# Patient Record
Sex: Female | Born: 1985 | Race: White | Hispanic: No | Marital: Married | Smoking: Never smoker
Health system: Southern US, Community
[De-identification: ages and names within clinical notes are randomized; demographics above are authoritative.]

## PROBLEM LIST (undated history)

## (undated) ENCOUNTER — Inpatient Hospital Stay (HOSPITAL_COMMUNITY): Payer: Self-pay

## (undated) DIAGNOSIS — T7840XA Allergy, unspecified, initial encounter: Secondary | ICD-10-CM

## (undated) DIAGNOSIS — R519 Headache, unspecified: Secondary | ICD-10-CM

## (undated) DIAGNOSIS — R51 Headache: Secondary | ICD-10-CM

## (undated) DIAGNOSIS — T4145XA Adverse effect of unspecified anesthetic, initial encounter: Secondary | ICD-10-CM

## (undated) HISTORY — PX: URETHRAL DILATION: SUR417

## (undated) HISTORY — DX: Allergy, unspecified, initial encounter: T78.40XA

## (undated) HISTORY — PX: KIDNEY SURGERY: SHX687

## (undated) HISTORY — PX: APPENDECTOMY: SHX54

---

## 2007-08-26 DIAGNOSIS — T8859XA Other complications of anesthesia, initial encounter: Secondary | ICD-10-CM

## 2007-08-26 HISTORY — DX: Other complications of anesthesia, initial encounter: T88.59XA

## 2013-05-10 ENCOUNTER — Encounter: Payer: Self-pay | Admitting: Obstetrics & Gynecology

## 2013-05-10 ENCOUNTER — Ambulatory Visit (INDEPENDENT_AMBULATORY_CARE_PROVIDER_SITE_OTHER): Payer: 59 | Admitting: Obstetrics & Gynecology

## 2013-05-10 VITALS — BP 127/69 | HR 83 | Ht 62.0 in | Wt 157.0 lb

## 2013-05-10 DIAGNOSIS — Z30432 Encounter for removal of intrauterine contraceptive device: Secondary | ICD-10-CM

## 2013-05-10 DIAGNOSIS — Z01419 Encounter for gynecological examination (general) (routine) without abnormal findings: Secondary | ICD-10-CM

## 2013-05-10 DIAGNOSIS — Z124 Encounter for screening for malignant neoplasm of cervix: Secondary | ICD-10-CM

## 2013-05-10 NOTE — Patient Instructions (Signed)
Preventive Care for Adults, Female A healthy lifestyle and preventive care can promote health and wellness. Preventive health guidelines for women include the following key practices.  A routine yearly physical is a good way to check with your caregiver about your health and preventive screening. It is a chance to share any concerns and updates on your health, and to receive a thorough exam.  Visit your dentist for a routine exam and preventive care every 6 months. Brush your teeth twice a day and floss once a day. Good oral hygiene prevents tooth decay and gum disease.  The frequency of eye exams is based on your age, health, family medical history, use of contact lenses, and other factors. Follow your caregiver's recommendations for frequency of eye exams.  Eat a healthy diet. Foods like vegetables, fruits, whole grains, low-fat dairy products, and lean protein foods contain the nutrients you need without too many calories. Decrease your intake of foods high in solid fats, added sugars, and salt. Eat the right amount of calories for you.Get information about a proper diet from your caregiver, if necessary.  Regular physical exercise is one of the most important things you can do for your health. Most adults should get at least 150 minutes of moderate-intensity exercise (any activity that increases your heart rate and causes you to sweat) each week. In addition, most adults need muscle-strengthening exercises on 2 or more days a week.  Maintain a healthy weight. The body mass index (BMI) is a screening tool to identify possible weight problems. It provides an estimate of body fat based on height and weight. Your caregiver can help determine your BMI, and can help you achieve or maintain a healthy weight.For adults 20 years and older:  A BMI below 18.5 is considered underweight.  A BMI of 18.5 to 24.9 is normal.  A BMI of 25 to 29.9 is considered overweight.  A BMI of 30 and above is  considered obese.  Maintain normal blood lipids and cholesterol levels by exercising and minimizing your intake of saturated fat. Eat a balanced diet with plenty of fruit and vegetables. Blood tests for lipids and cholesterol should begin at age 41 and be repeated every 5 years. If your lipid or cholesterol levels are high, you are over 50, or you are at high risk for heart disease, you may need your cholesterol levels checked more frequently.Ongoing high lipid and cholesterol levels should be treated with medicines if diet and exercise are not effective.  If you smoke, find out from your caregiver how to quit. If you do not use tobacco, do not start.  If you are pregnant, do not drink alcohol. If you are breastfeeding, be very cautious about drinking alcohol. If you are not pregnant and choose to drink alcohol, do not exceed 1 drink per day. One drink is considered to be 12 ounces (355 mL) of beer, 5 ounces (148 mL) of wine, or 1.5 ounces (44 mL) of liquor.  Avoid use of street drugs. Do not share needles with anyone. Ask for help if you need support or instructions about stopping the use of drugs.  High blood pressure causes heart disease and increases the risk of stroke. Your blood pressure should be checked at least every 1 to 2 years. Ongoing high blood pressure should be treated with medicines if weight loss and exercise are not effective.  If you are 65 to 28 years old, ask your caregiver if you should take aspirin to prevent strokes.  Diabetes  screening involves taking a blood sample to check your fasting blood sugar level. This should be done once every 3 years, after age 45, if you are within normal weight and without risk factors for diabetes. Testing should be considered at a younger age or be carried out more frequently if you are overweight and have at least 1 risk factor for diabetes.  Breast cancer screening is essential preventive care for women. You should practice "breast  self-awareness." This means understanding the normal appearance and feel of your breasts and may include breast self-examination. Any changes detected, no matter how small, should be reported to a caregiver. Women in their 20s and 30s should have a clinical breast exam (CBE) by a caregiver as part of a regular health exam every 1 to 3 years. After age 40, women should have a CBE every year. Starting at age 40, women should consider having a mammography (breast X-ray test) every year. Women who have a family history of breast cancer should talk to their caregiver about genetic screening. Women at a high risk of breast cancer should talk to their caregivers about having magnetic resonance imaging (MRI) and a mammography every year.  The Pap test is a screening test for cervical cancer. A Pap test can show cell changes on the cervix that might become cervical cancer if left untreated. A Pap test is a procedure in which cells are obtained and examined from the lower end of the uterus (cervix).  Women should have a Pap test starting at age 21.  Between ages 21 and 29, Pap tests should be repeated every 2 years.  Beginning at age 30, you should have a Pap test every 3 years as long as the past 3 Pap tests have been normal.  Some women have medical problems that increase the chance of getting cervical cancer. Talk to your caregiver about these problems. It is especially important to talk to your caregiver if a new problem develops soon after your last Pap test. In these cases, your caregiver may recommend more frequent screening and Pap tests.  The above recommendations are the same for women who have or have not gotten the vaccine for human papillomavirus (HPV).  If you had a hysterectomy for a problem that was not cancer or a condition that could lead to cancer, then you no longer need Pap tests. Even if you no longer need a Pap test, a regular exam is a good idea to make sure no other problems are  starting.  If you are between ages 65 and 70, and you have had normal Pap tests going back 10 years, you no longer need Pap tests. Even if you no longer need a Pap test, a regular exam is a good idea to make sure no other problems are starting.  If you have had past treatment for cervical cancer or a condition that could lead to cancer, you need Pap tests and screening for cancer for at least 20 years after your treatment.  If Pap tests have been discontinued, risk factors (such as a new sexual partner) need to be reassessed to determine if screening should be resumed.  The HPV test is an additional test that may be used for cervical cancer screening. The HPV test looks for the virus that can cause the cell changes on the cervix. The cells collected during the Pap test can be tested for HPV. The HPV test could be used to screen women aged 30 years and older, and should   be used in women of any age who have unclear Pap test results. After the age of 30, women should have HPV testing at the same frequency as a Pap test.  Colorectal cancer can be detected and often prevented. Most routine colorectal cancer screening begins at the age of 50 and continues through age 75. However, your caregiver may recommend screening at an earlier age if you have risk factors for colon cancer. On a yearly basis, your caregiver may provide home test kits to check for hidden blood in the stool. Use of a small camera at the end of a tube, to directly examine the colon (sigmoidoscopy or colonoscopy), can detect the earliest forms of colorectal cancer. Talk to your caregiver about this at age 50, when routine screening begins. Direct examination of the colon should be repeated every 5 to 10 years through age 75, unless early forms of pre-cancerous polyps or small growths are found.  Hepatitis C blood testing is recommended for all people born from 1945 through 1965 and any individual with known risks for hepatitis C.  Practice  safe sex. Use condoms and avoid high-risk sexual practices to reduce the spread of sexually transmitted infections (STIs). STIs include gonorrhea, chlamydia, syphilis, trichomonas, herpes, HPV, and human immunodeficiency virus (HIV). Herpes, HIV, and HPV are viral illnesses that have no cure. They can result in disability, cancer, and death. Sexually active women aged 25 and younger should be checked for chlamydia. Older women with new or multiple partners should also be tested for chlamydia. Testing for other STIs is recommended if you are sexually active and at increased risk.  Osteoporosis is a disease in which the bones lose minerals and strength with aging. This can result in serious bone fractures. The risk of osteoporosis can be identified using a bone density scan. Women ages 65 and over and women at risk for fractures or osteoporosis should discuss screening with their caregivers. Ask your caregiver whether you should take a calcium supplement or vitamin D to reduce the rate of osteoporosis.  Menopause can be associated with physical symptoms and risks. Hormone replacement therapy is available to decrease symptoms and risks. You should talk to your caregiver about whether hormone replacement therapy is right for you.  Use sunscreen with sun protection factor (SPF) of 30 or more. Apply sunscreen liberally and repeatedly throughout the day. You should seek shade when your shadow is shorter than you. Protect yourself by wearing long sleeves, pants, a wide-brimmed hat, and sunglasses year round, whenever you are outdoors.  Once a month, do a whole body skin exam, using a mirror to look at the skin on your back. Notify your caregiver of new moles, moles that have irregular borders, moles that are larger than a pencil eraser, or moles that have changed in shape or color.  Stay current with required immunizations.  Influenza. You need a dose every fall (or winter). The composition of the flu vaccine  changes each year, so being vaccinated once is not enough.  Pneumococcal polysaccharide. You need 1 to 2 doses if you smoke cigarettes or if you have certain chronic medical conditions. You need 1 dose at age 65 (or older) if you have never been vaccinated.  Tetanus, diphtheria, pertussis (Tdap, Td). Get 1 dose of Tdap vaccine if you are younger than age 65, are over 65 and have contact with an infant, are a healthcare worker, are pregnant, or simply want to be protected from whooping cough. After that, you need a Td   booster dose every 10 years. Consult your caregiver if you have not had at least 3 tetanus and diphtheria-containing shots sometime in your life or have a deep or dirty wound.  HPV. You need this vaccine if you are a woman age 26 or younger. The vaccine is given in 3 doses over 6 months.  Measles, mumps, rubella (MMR). You need at least 1 dose of MMR if you were born in 1957 or later. You may also need a second dose.  Meningococcal. If you are age 19 to 21 and a first-year college student living in a residence hall, or have one of several medical conditions, you need to get vaccinated against meningococcal disease. You may also need additional booster doses.  Zoster (shingles). If you are age 60 or older, you should get this vaccine.  Varicella (chickenpox). If you have never had chickenpox or you were vaccinated but received only 1 dose, talk to your caregiver to find out if you need this vaccine.  Hepatitis A. You need this vaccine if you have a specific risk factor for hepatitis A virus infection or you simply wish to be protected from this disease. The vaccine is usually given as 2 doses, 6 to 18 months apart.  Hepatitis B. You need this vaccine if you have a specific risk factor for hepatitis B virus infection or you simply wish to be protected from this disease. The vaccine is given in 3 doses, usually over 6 months. Preventive Services / Frequency Ages 19 to 39  Blood  pressure check.** / Every 1 to 2 years.  Lipid and cholesterol check.** / Every 5 years beginning at age 20.  Clinical breast exam.** / Every 3 years for women in their 20s and 30s.  Pap test.** / Every 2 years from ages 21 through 29. Every 3 years starting at age 30 through age 65 or 70 with a history of 3 consecutive normal Pap tests.  HPV screening.** / Every 3 years from ages 30 through ages 65 to 70 with a history of 3 consecutive normal Pap tests.  Hepatitis C blood test.** / For any individual with known risks for hepatitis C.  Skin self-exam. / Monthly.  Influenza immunization.** / Every year.  Pneumococcal polysaccharide immunization.** / 1 to 2 doses if you smoke cigarettes or if you have certain chronic medical conditions.  Tetanus, diphtheria, pertussis (Tdap, Td) immunization. / A one-time dose of Tdap vaccine. After that, you need a Td booster dose every 10 years.  HPV immunization. / 3 doses over 6 months, if you are 26 and younger.  Measles, mumps, rubella (MMR) immunization. / You need at least 1 dose of MMR if you were born in 1957 or later. You may also need a second dose.  Meningococcal immunization. / 1 dose if you are age 19 to 21 and a first-year college student living in a residence hall, or have one of several medical conditions, you need to get vaccinated against meningococcal disease. You may also need additional booster doses.  Varicella immunization.** / Consult your caregiver.  Hepatitis A immunization.** / Consult your caregiver. 2 doses, 6 to 18 months apart.  Hepatitis B immunization.** / Consult your caregiver. 3 doses usually over 6 months. Ages 40 to 64  Blood pressure check.** / Every 1 to 2 years.  Lipid and cholesterol check.** / Every 5 years beginning at age 20.  Clinical breast exam.** / Every year after age 40.  Mammogram.** / Every year beginning at age 40   and continuing for as long as you are in good health. Consult with your  caregiver.  Pap test.** / Every 3 years starting at age 30 through age 65 or 70 with a history of 3 consecutive normal Pap tests.  HPV screening.** / Every 3 years from ages 30 through ages 65 to 70 with a history of 3 consecutive normal Pap tests.  Fecal occult blood test (FOBT) of stool. / Every year beginning at age 50 and continuing until age 75. You may not need to do this test if you get a colonoscopy every 10 years.  Flexible sigmoidoscopy or colonoscopy.** / Every 5 years for a flexible sigmoidoscopy or every 10 years for a colonoscopy beginning at age 50 and continuing until age 75.  Hepatitis C blood test.** / For all people born from 1945 through 1965 and any individual with known risks for hepatitis C.  Skin self-exam. / Monthly.  Influenza immunization.** / Every year.  Pneumococcal polysaccharide immunization.** / 1 to 2 doses if you smoke cigarettes or if you have certain chronic medical conditions.  Tetanus, diphtheria, pertussis (Tdap, Td) immunization.** / A one-time dose of Tdap vaccine. After that, you need a Td booster dose every 10 years.  Measles, mumps, rubella (MMR) immunization. / You need at least 1 dose of MMR if you were born in 1957 or later. You may also need a second dose.  Varicella immunization.** / Consult your caregiver.  Meningococcal immunization.** / Consult your caregiver.  Hepatitis A immunization.** / Consult your caregiver. 2 doses, 6 to 18 months apart.  Hepatitis B immunization.** / Consult your caregiver. 3 doses, usually over 6 months. Ages 65 and over  Blood pressure check.** / Every 1 to 2 years.  Lipid and cholesterol check.** / Every 5 years beginning at age 20.  Clinical breast exam.** / Every year after age 40.  Mammogram.** / Every year beginning at age 40 and continuing for as long as you are in good health. Consult with your caregiver.  Pap test.** / Every 3 years starting at age 30 through age 65 or 70 with a 3  consecutive normal Pap tests. Testing can be stopped between 65 and 70 with 3 consecutive normal Pap tests and no abnormal Pap or HPV tests in the past 10 years.  HPV screening.** / Every 3 years from ages 30 through ages 65 or 70 with a history of 3 consecutive normal Pap tests. Testing can be stopped between 65 and 70 with 3 consecutive normal Pap tests and no abnormal Pap or HPV tests in the past 10 years.  Fecal occult blood test (FOBT) of stool. / Every year beginning at age 50 and continuing until age 75. You may not need to do this test if you get a colonoscopy every 10 years.  Flexible sigmoidoscopy or colonoscopy.** / Every 5 years for a flexible sigmoidoscopy or every 10 years for a colonoscopy beginning at age 50 and continuing until age 75.  Hepatitis C blood test.** / For all people born from 1945 through 1965 and any individual with known risks for hepatitis C.  Osteoporosis screening.** / A one-time screening for women ages 65 and over and women at risk for fractures or osteoporosis.  Skin self-exam. / Monthly.  Influenza immunization.** / Every year.  Pneumococcal polysaccharide immunization.** / 1 dose at age 65 (or older) if you have never been vaccinated.  Tetanus, diphtheria, pertussis (Tdap, Td) immunization. / A one-time dose of Tdap vaccine if you are over   65 and have contact with an infant, are a Research scientist (physical sciences), or simply want to be protected from whooping cough. After that, you need a Td booster dose every 10 years.  Varicella immunization.** / Consult your caregiver.  Meningococcal immunization.** / Consult your caregiver.  Hepatitis A immunization.** / Consult your caregiver. 2 doses, 6 to 18 months apart.  Hepatitis B immunization.** / Check with your caregiver. 3 doses, usually over 6 months. ** Family history and personal history of risk and conditions may change your caregiver's recommendations. Document Released: 10/07/2001 Document Revised: 11/03/2011  Document Reviewed: 01/06/2011 [facility] Patient Information 2014 [place], [place].  Thank you for enrolling in MyChart. Please follow the instructions below to securely access your online medical record. MyChart allows you to send messages to your doctor, view your test results, manage appointments, and more.   How Do I Sign Up? 1. In your Internet browser, go to [employer] and enter [email or web address]. 2. Click on the Sign Up Now link in the Sign In box. You will see the New Member Sign Up page. 3. Enter your MyChart Access Code exactly as it appears below. You will not need to use this code after you've completed the sign-up process. If you do not sign up before the expiration date, you must request a new code. MyChart Access Code: [email or web address] Expires: 07/09/2013  9:43 AM  4. Enter your Social Security Number ([id number]) and Date of Birth (mm/dd/yyyy) as indicated and click Submit. You will be taken to the next sign-up page. 5. Create a MyChart ID. This will be your MyChart login ID and cannot be changed, so think of one that is secure and easy to remember. 6. Create a MyChart password. You can change your password at any time. 7. Enter your Password Reset Question and Answer. This can be used at a later time if you forget your password.  8. Enter your e-mail address. You will receive e-mail notification when new information is available in MyChart. 9. Click Sign Up. You can now view your medical record.   Additional Information Remember, MyChart is NOT to be used for urgent needs. For medical emergencies, dial 911.

## 2013-05-10 NOTE — Progress Notes (Signed)
  Subjective:     Loretta Sanders is a 27 y.o. G62P1102 female and is here for a comprehensive physical exam. The patient reports no problems. She desires Mirena IUD removal, unable to feel strings.  IUD placed 5 years ago, will plans to use condoms for contraception until she can tlak her husband into getting a vasectomy.   History   Social History  . Marital Status: Married    Spouse Name: N/A    Number of Children: N/A  . Years of Education: N/A   Occupational History  . Not on file.   Social History Main Topics  . Smoking status: Never Smoker   . Smokeless tobacco: Never Used  . Alcohol Use: Yes     Comment: rare  . Drug Use: No  . Sexual Activity: Yes    Partners: Male    Birth Control/ Protection: IUD   Other Topics Concern  . Not on file   Social History Narrative  . No narrative on file   No health maintenance topics applied.  The following portions of the patient's history were reviewed and updated as appropriate: allergies, current medications, past family history, past medical history, past social history, past surgical history and problem list.  Review of Systems Pertinent items are noted in HPI.   Objective:   BP 127/69  Pulse 83  Ht 5\' 2"  (1.575 m)  Wt 157 lb (71.215 kg)  BMI 28.71 kg/m2 GENERAL: Well-developed, well-nourished female in no acute distress.  HEENT: Normocephalic, atraumatic. Sclerae anicteric.  NECK: Supple. Normal thyroid.  LUNGS: Clear to auscultation bilaterally.  HEART: Regular rate and rhythm. BREASTS: Symmetric in size. No masses, skin changes, nipple drainage, or lymphadenopathy. ABDOMEN: Soft, nontender, nondistended. No organomegaly. PELVIC: Normal external female genitalia. Vagina is pink and rugated.  Normal discharge. Normal cervix contour, no IUD strings seen. Pap smear obtained. Uterus is normal in size. No adnexal mass or tenderness.  EXTREMITIES: No cyanosis, clubbing, or edema, 2+ distal pulses.  IUD Removal  Patient  was in the dorsal lithotomy position, normal external genitalia was noted.  A speculum was placed in the patient's vagina, normal discharge was noted, no lesions. The cervix was visualized, no lesions, no abnormal discharge.  The strings of the IUD were not visualized, so there cervix was swabbed with betadine. Given that she had a pinpoint cervical os, plastic dilators were used to dilate there cervical canal, and bedside ultrasound did show the IUD in the endometrial cavity.  Kelly forceps were then introduced into the endometrial cavity and after many attempts under ultrasound guidance,  the IUD was grasped and removed in its entirety.  Patient tolerated the procedure well.    Assessment:   Healthy female exam IUD removal   Plan:   Pap done, will follow up results and manage accordingly. Patient to let us know if she changes her mind about her long term contraception modality Routine preventative health maintenance measures emphasized

## 2013-08-25 LAB — HM PAP SMEAR: HM Pap smear: NORMAL

## 2014-05-11 ENCOUNTER — Encounter: Payer: Self-pay | Admitting: Obstetrics & Gynecology

## 2014-05-11 ENCOUNTER — Ambulatory Visit (INDEPENDENT_AMBULATORY_CARE_PROVIDER_SITE_OTHER): Payer: 59 | Admitting: Obstetrics & Gynecology

## 2014-05-11 VITALS — BP 104/73 | HR 75 | Wt 147.0 lb

## 2014-05-11 DIAGNOSIS — O2390 Unspecified genitourinary tract infection in pregnancy, unspecified trimester: Secondary | ICD-10-CM

## 2014-05-11 DIAGNOSIS — Z3480 Encounter for supervision of other normal pregnancy, unspecified trimester: Secondary | ICD-10-CM

## 2014-05-11 DIAGNOSIS — O09219 Supervision of pregnancy with history of pre-term labor, unspecified trimester: Secondary | ICD-10-CM

## 2014-05-11 DIAGNOSIS — N390 Urinary tract infection, site not specified: Secondary | ICD-10-CM

## 2014-05-11 DIAGNOSIS — O34219 Maternal care for unspecified type scar from previous cesarean delivery: Secondary | ICD-10-CM | POA: Insufficient documentation

## 2014-05-11 DIAGNOSIS — O2341 Unspecified infection of urinary tract in pregnancy, first trimester: Secondary | ICD-10-CM

## 2014-05-11 NOTE — Progress Notes (Signed)
     Subjective:    Loretta Sanders is a 28 y.o. G3P1102 at [redacted]w[redacted]d being seen today for her first obstetrical visit.  Her obstetrical history is significant for previous preterm delivery at 34 weeks (was on 17P last pregnancy and got to 39 weeks), two previous cesarean deliveries. Patient does intend to breast feed. Pregnancy history fully reviewed.  Patient reports no complaints.  Filed Vitals:   05/11/14 1027  BP: 104/73  Pulse: 75  Weight: 147 lb (66.679 kg)    HISTORY: OB History  Gravida Para Term Preterm AB SAB TAB Ectopic Multiple Living  # Outcome Date GA Lbr Len/2nd Weight Sex Delivery Anes PTL Lv  3 CUR           2 TRM 04/14/08 [redacted]w[redacted]d   F [facility]        Comments: Received 17P  1 PRE 10/31/06 [redacted]w[redacted]d   F [facility]        Past Medical History  Diagnosis Date  . Allergy    Past Surgical History  Procedure Laterality Date  . Kidney surgery      age 66  . Cesarean section      X 2 in 2008, 2009   Family History  Problem Relation Age of Onset  . COPD Maternal Grandfather     lung  . COPD Paternal Grandmother     lung  . COPD Paternal Grandfather     lung cancer     Exam    Uterus:     Pelvic Exam:    Perineum: No Hemorrhoids, Normal Perineum   Vulva: normal   Vagina:  normal mucosa, normal discharge   Cervix: no bleeding following Pap and no lesions   Adnexa: normal adnexa and no mass, fullness, tenderness   Bony Pelvis: average  System: Breast:  normal appearance, no masses or tenderness, Inspection negative   Skin: normal coloration and turgor, no rashes   Neurologic: oriented, normal   Extremities: normal strength, tone, and muscle mass   HEENT PERRLA and extra ocular movement intact   Mouth/Teeth mucous membranes moist, pharynx normal without lesions and dental hygiene good   Neck supple and no masses   Cardiovascular: regular rate and rhythm   Respiratory:  appears well, vitals normal, no respiratory distress, acyanotic, normal RR, chest  clear, no wheezing, crepitations, rhonchi, normal symmetric air entry   Abdomen: soft, non-tender; bowel sounds normal; no masses,  no organomegaly   Urinary: urethral meatus normal   Clinic scan: Viable 7 week SIUP   Assessment:    Pregnancy: [id number] Patient Active Problem List   Diagnosis Date Noted  . Previous preterm delivery at 39 weeks affecting pregnancy, antepartum 05/11/2014  . Previous cesarean delivery x 2, antepartum 05/11/2014    Plan:   Initial labs drawn. Continue prenatal vitamins. Will start 17P at 16 weeks, continue weekly until 36 weeks Interested in RCS and BTL for delivery Problem list reviewed and updated. Genetic Screening discussed Quad Screen: to be ordered later. Ultrasound discussed; fetal survey: ordered. The nature of [facility] with multiple MDs and other Advanced Practitioners was explained to patient; also emphasized that residents, students are part of our team. Follow up in 4 weeks.  Routine obstetric precautions reviewed.  Tereso Newcomer, MD 05/11/2014

## 2014-05-11 NOTE — Progress Notes (Signed)
New OB, last pap was May 10, 2013.  It was negative.  Bedside ultrasound shows CRL measuring 7wk 0days. Consistent with LMP dating of 7wk 2days.

## 2014-05-11 NOTE — Patient Instructions (Addendum)
Thank you for enrolling in MyChart. Please follow the instructions below to securely access your online medical record. MyChart allows you to send messages to your doctor, view your test results, manage appointments, and more.   How Do I Sign Up? 1. In your Internet browser, go to [employer] and enter [email or web address]. 2. Click on the Sign Up Now link in the Sign In box. You will see the New Member Sign Up page. 3. Enter your MyChart Access Code exactly as it appears below. You will not need to use this code after you've completed the sign-up process. If you do not sign up before the expiration date, you must request a new code.  MyChart Access Code: [email or web address] Expires: 07/10/2014 11:16 AM  4. Enter your Social Security Number ([email or web address]) and Date of Birth (mm/dd/yyyy) as indicated and click Submit. You will be taken to the next sign-up page. 5. Create a MyChart ID. This will be your MyChart login ID and cannot be changed, so think of one that is secure and easy to remember. 6. Create a MyChart password. You can change your password at any time. 7. Enter your Password Reset Question and Answer. This can be used at a later time if you forget your password.  8. Enter your e-mail address. You will receive e-mail notification when new information is available in MyChart. 9. Click Sign Up. You can now view your medical record.   Additional Information Remember, MyChart is NOT to be used for urgent needs. For medical emergencies, dial 911.    First Trimester of Pregnancy The first trimester of pregnancy is from week 1 until the end of week 12 (months 1 through 3). A week after a sperm fertilizes an egg, the egg will implant on the wall of the uterus. This embryo will begin to develop into a baby. Genes from you and your partner are forming the baby. The female genes determine whether the baby is a boy or a girl. At 6-8 weeks, the eyes and face are formed, and the heartbeat  can be seen on ultrasound. At the end of 12 weeks, all the baby's organs are formed.  Now that you are pregnant, you will want to do everything you can to have a healthy baby. Two of the most important things are to get good prenatal care and to follow your health care provider's instructions. Prenatal care is all the medical care you receive before the baby's birth. This care will help prevent, find, and treat any problems during the pregnancy and childbirth. BODY CHANGES Your body goes through many changes during pregnancy. The changes vary from woman to woman.   You may gain or lose a couple of pounds at first.  You may feel sick to your stomach (nauseous) and throw up (vomit). If the vomiting is uncontrollable, call your health care provider.  You may tire easily.  You may develop headaches that can be relieved by medicines approved by your health care provider.  You may urinate more often. Painful urination may mean you have a bladder infection.  You may develop heartburn as a result of your pregnancy.  You may develop constipation because certain hormones are causing the muscles that push waste through your intestines to slow down.  You may develop hemorrhoids or swollen, bulging veins (varicose veins).  Your breasts may begin to grow larger and become tender. Your nipples may stick out more, and the tissue that surrounds them (areola) may become darker.  Your gums may  bleed and may be sensitive to brushing and flossing.  Dark spots or blotches (chloasma, mask of pregnancy) may develop on your face. This will likely fade after the baby is born.  Your menstrual periods will stop.  You may have a loss of appetite.  You may develop cravings for certain kinds of food.  You may have changes in your emotions from day to day, such as being excited to be pregnant or being concerned that something may go wrong with the pregnancy and baby.  You may have more vivid and strange  dreams.  You may have changes in your hair. These can include thickening of your hair, rapid growth, and changes in texture. Some women also have hair loss during or after pregnancy, or hair that feels dry or thin. Your hair will most likely return to normal after your baby is born. WHAT TO EXPECT AT YOUR PRENATAL VISITS During a routine prenatal visit:  You will be weighed to make sure you and the baby are growing normally.  Your blood pressure will be taken.  Your abdomen will be measured to track your baby's growth.  The fetal heartbeat will be listened to starting around week 10 or 12 of your pregnancy.  Test results from any previous visits will be discussed. Your health care provider may ask you:  How you are feeling.  If you are feeling the baby move.  If you have had any abnormal symptoms, such as leaking fluid, bleeding, severe headaches, or abdominal cramping.  If you have any questions. Other tests that may be performed during your first trimester include:  Blood tests to find your blood type and to check for the presence of any previous infections. They will also be used to check for low iron levels (anemia) and Rh antibodies. Later in the pregnancy, blood tests for diabetes will be done along with other tests if problems develop.  Urine tests to check for infections, diabetes, or protein in the urine.  An ultrasound to confirm the proper growth and development of the baby.  An amniocentesis to check for possible genetic problems.  Fetal screens for spina bifida and Down syndrome.  You may need other tests to make sure you and the baby are doing well. HOME CARE INSTRUCTIONS  Medicines  Follow your health care provider's instructions regarding medicine use. Specific medicines may be either safe or unsafe to take during pregnancy.  Take your prenatal vitamins as directed.  If you develop constipation, try taking a stool softener if your health care provider  approves. Diet  Eat regular, well-balanced meals. Choose a variety of foods, such as meat or vegetable-based protein, fish, milk and low-fat dairy products, vegetables, fruits, and whole grain breads and cereals. Your health care provider will help you determine the amount of weight gain that is right for you.  Avoid raw meat and uncooked cheese. These carry germs that can cause birth defects in the baby.  Eating four or five small meals rather than three large meals a day may help relieve nausea and vomiting. If you start to feel nauseous, eating a few soda crackers can be helpful. Drinking liquids between meals instead of during meals also seems to help nausea and vomiting.  If you develop constipation, eat more high-fiber foods, such as fresh vegetables or fruit and whole grains. Drink enough fluids to keep your urine clear or pale yellow. Activity and Exercise  Exercise only as directed by your health care provider. Exercising will help you:  Control your weight.  Stay in shape.  Be prepared for labor and delivery.  Experiencing pain or cramping in the lower abdomen or low back is a good sign that you should stop exercising. Check with your health care provider before continuing normal exercises.  Try to avoid standing for long periods of time. Move your legs often if you must stand in one place for a long time.  Avoid heavy lifting.  Wear low-heeled shoes, and practice good posture.  You may continue to have sex unless your health care provider directs you otherwise. Relief of Pain or Discomfort  Wear a good support bra for breast tenderness.   Take warm sitz baths to soothe any pain or discomfort caused by hemorrhoids. Use hemorrhoid cream if your health care provider approves.   Rest with your legs elevated if you have leg cramps or low back pain.  If you develop varicose veins in your legs, wear support hose. Elevate your feet for 15 minutes, 3-4 times a day. Limit salt  in your diet. Prenatal Care  Schedule your prenatal visits by the twelfth week of pregnancy. They are usually scheduled monthly at first, then more often in the last 2 months before delivery.  Write down your questions. Take them to your prenatal visits.  Keep all your prenatal visits as directed by your health care provider. Safety  Wear your seat belt at all times when driving.  Make a list of emergency phone numbers, including numbers for family, friends, the hospital, and police and fire departments. General Tips  Ask your health care provider for a referral to a local prenatal education class. Begin classes no later than at the beginning of month 6 of your pregnancy.  Ask for help if you have counseling or nutritional needs during pregnancy. Your health care provider can offer advice or refer you to specialists for help with various needs.  Do not use hot tubs, steam rooms, or saunas.  Do not douche or use tampons or scented sanitary pads.  Do not cross your legs for long periods of time.  Avoid cat litter boxes and soil used by cats. These carry germs that can cause birth defects in the baby and possibly loss of the fetus by miscarriage or stillbirth.  Avoid all smoking, herbs, alcohol, and medicines not prescribed by your health care provider. Chemicals in these affect the formation and growth of the baby.  Schedule a dentist appointment. At home, brush your teeth with a soft toothbrush and be gentle when you floss. SEEK MEDICAL CARE IF:   You have dizziness.  You have mild pelvic cramps, pelvic pressure, or nagging pain in the abdominal area.  You have persistent nausea, vomiting, or diarrhea.  You have a bad smelling vaginal discharge.  You have pain with urination.  You notice increased swelling in your face, hands, legs, or ankles. SEEK IMMEDIATE MEDICAL CARE IF:   You have a fever.  You are leaking fluid from your vagina.  You have spotting or bleeding  from your vagina.  You have severe abdominal cramping or pain.  You have rapid weight gain or loss.  You vomit blood or material that looks like coffee grounds.  You are exposed to [place] measles and have never had them.  You are exposed to fifth disease or chickenpox.  You develop a severe headache.  You have shortness of breath.  You have any kind of trauma, such as from a fall or a car accident. Document Released: 08/05/2001  Document Revised: 12/26/2013 Document Reviewed: 06/21/2013 [facility] Patient Information 2015 [place], [place]. This information is not intended to replace advice given to you by your health care provider. Make sure you discuss any questions you have with your health care provider.

## 2014-05-12 LAB — OBSTETRIC PANEL
Antibody Screen: NEGATIVE
BASOS PCT: 0 % (ref 0–1)
Basophils Absolute: 0 10*3/uL (ref 0.0–0.1)
EOS ABS: 0 10*3/uL (ref 0.0–0.7)
Eosinophils Relative: 0 % (ref 0–5)
HCT: 38.5 % (ref 36.0–46.0)
HEMOGLOBIN: 13.3 g/dL (ref 12.0–15.0)
HEP B S AG: NEGATIVE
LYMPHS ABS: 2.6 10*3/uL (ref 0.7–4.0)
Lymphocytes Relative: 23 % (ref 12–46)
MCH: 30.6 pg (ref 26.0–34.0)
MCHC: 34.5 g/dL (ref 30.0–36.0)
MCV: 88.5 fL (ref 78.0–100.0)
MONOS PCT: 5 % (ref 3–12)
Monocytes Absolute: 0.6 10*3/uL (ref 0.1–1.0)
NEUTROS ABS: 8.1 10*3/uL — AB (ref 1.7–7.7)
NEUTROS PCT: 72 % (ref 43–77)
PLATELETS: 318 10*3/uL (ref 150–400)
RBC: 4.35 MIL/uL (ref 3.87–5.11)
RDW: 12.7 % (ref 11.5–15.5)
RH TYPE: POSITIVE
Rubella: 1.81 Index — ABNORMAL HIGH (ref <0.90)
WBC: 11.3 10*3/uL — ABNORMAL HIGH (ref 4.0–10.5)

## 2014-05-12 LAB — HIV ANTIBODY (ROUTINE TESTING W REFLEX): HIV 1&2 Ab, 4th Generation: NONREACTIVE

## 2014-05-14 LAB — CULTURE, OB URINE

## 2014-05-15 ENCOUNTER — Telehealth: Payer: Self-pay | Admitting: *Deleted

## 2014-05-15 MED ORDER — CEPHALEXIN 500 MG PO CAPS: 500.0000 mg | ORAL_CAPSULE | Freq: Four times a day (QID) | ORAL | Status: DC

## 2014-05-15 NOTE — Telephone Encounter (Signed)
Patient was notified and she will pick up medication today.

## 2014-05-15 NOTE — Addendum Note (Signed)
Addended by: Jaynie Collins A on: 05/15/2014 12:15 PM   Modules accepted: Orders

## 2014-05-15 NOTE — Telephone Encounter (Signed)
Message copied by Barbara Cower on Mon May 15, 2014 12:23 PM ------      Message from: Jaynie Collins A      Created: Mon May 15, 2014 12:16 PM       Urine culture shows >100K E.coli, Keflex prescribed.  Please call to inform patient of results and advise her to pick up prescription.        ------

## 2014-06-12 ENCOUNTER — Encounter: Payer: Self-pay | Admitting: Family Medicine

## 2014-06-12 ENCOUNTER — Ambulatory Visit (INDEPENDENT_AMBULATORY_CARE_PROVIDER_SITE_OTHER): Payer: 59 | Admitting: Family Medicine

## 2014-06-12 VITALS — BP 140/72 | HR 87 | Wt 148.0 lb

## 2014-06-12 DIAGNOSIS — O34219 Maternal care for unspecified type scar from previous cesarean delivery: Secondary | ICD-10-CM

## 2014-06-12 DIAGNOSIS — O09219 Supervision of pregnancy with history of pre-term labor, unspecified trimester: Secondary | ICD-10-CM

## 2014-06-12 DIAGNOSIS — O3421 Maternal care for scar from previous cesarean delivery: Secondary | ICD-10-CM

## 2014-06-12 MED ORDER — HYDROXYPROGESTERONE CAPROATE 250 MG/ML IM OIL: 250.0000 mg | TOPICAL_OIL | Freq: Once | INTRAMUSCULAR | Status: DC

## 2014-06-12 NOTE — Patient Instructions (Signed)
First Trimester of Pregnancy The first trimester of pregnancy is from week 1 until the end of week 12 (months 1 through 3). A week after a sperm fertilizes an egg, the egg will implant on the wall of the uterus. This embryo will begin to develop into a baby. Genes from you and your partner are forming the baby. The female genes determine whether the baby is a boy or a girl. At 6-8 weeks, the eyes and face are formed, and the heartbeat can be seen on ultrasound. At the end of 12 weeks, all the baby's organs are formed.  Now that you are pregnant, you will want to do everything you can to have a healthy baby. Two of the most important things are to get good prenatal care and to follow your health care provider's instructions. Prenatal care is all the medical care you receive before the baby's birth. This care will help prevent, find, and treat any problems during the pregnancy and childbirth. BODY CHANGES Your body goes through many changes during pregnancy. The changes vary from woman to woman.   You may gain or lose a couple of pounds at first.  You may feel sick to your stomach (nauseous) and throw up (vomit). If the vomiting is uncontrollable, call your health care provider.  You may tire easily.  You may develop headaches that can be relieved by medicines approved by your health care provider.  You may urinate more often. Painful urination may mean you have a bladder infection.  You may develop heartburn as a result of your pregnancy.  You may develop constipation because certain hormones are causing the muscles that push waste through your intestines to slow down.  You may develop hemorrhoids or swollen, bulging veins (varicose veins).  Your breasts may begin to grow larger and become tender. Your nipples may stick out more, and the tissue that surrounds them (areola) may become darker.  Your gums may bleed and may be sensitive to brushing and flossing.  Dark spots or blotches  (chloasma, mask of pregnancy) may develop on your face. This will likely fade after the baby is born.  Your menstrual periods will stop.  You may have a loss of appetite.  You may develop cravings for certain kinds of food.  You may have changes in your emotions from day to day, such as being excited to be pregnant or being concerned that something may go wrong with the pregnancy and baby.  You may have more vivid and strange dreams.  You may have changes in your hair. These can include thickening of your hair, rapid growth, and changes in texture. Some women also have hair loss during or after pregnancy, or hair that feels dry or thin. Your hair will most likely return to normal after your baby is born. WHAT TO EXPECT AT YOUR PRENATAL VISITS During a routine prenatal visit:  You will be weighed to make sure you and the baby are growing normally.  Your blood pressure will be taken.  Your abdomen will be measured to track your baby's growth.  The fetal heartbeat will be listened to starting around week 10 or 12 of your pregnancy.  Test results from any previous visits will be discussed. Your health care provider may ask you:  How you are feeling.  If you are feeling the baby move.  If you have had any abnormal symptoms, such as leaking fluid, bleeding, severe headaches, or abdominal cramping.  If you have any questions. Other tests   that may be performed during your first trimester include:  Blood tests to find your blood type and to check for the presence of any previous infections. They will also be used to check for low iron levels (anemia) and Rh antibodies. Later in the pregnancy, blood tests for diabetes will be done along with other tests if problems develop.  Urine tests to check for infections, diabetes, or protein in the urine.  An ultrasound to confirm the proper growth and development of the baby.  An amniocentesis to check for possible genetic problems.  Fetal  screens for spina bifida and Down syndrome.  You may need other tests to make sure you and the baby are doing well. HOME CARE INSTRUCTIONS  Medicines  Follow your health care provider's instructions regarding medicine use. Specific medicines may be either safe or unsafe to take during pregnancy.  Take your prenatal vitamins as directed.  If you develop constipation, try taking a stool softener if your health care provider approves. Diet  Eat regular, well-balanced meals. Choose a variety of foods, such as meat or vegetable-based protein, fish, milk and low-fat dairy products, vegetables, fruits, and whole grain breads and cereals. Your health care provider will help you determine the amount of weight gain that is right for you.  Avoid raw meat and uncooked cheese. These carry germs that can cause birth defects in the baby.  Eating four or five small meals rather than three large meals a day may help relieve nausea and vomiting. If you start to feel nauseous, eating a few soda crackers can be helpful. Drinking liquids between meals instead of during meals also seems to help nausea and vomiting.  If you develop constipation, eat more high-fiber foods, such as fresh vegetables or fruit and whole grains. Drink enough fluids to keep your urine clear or pale yellow. Activity and Exercise  Exercise only as directed by your health care provider. Exercising will help you:  Control your weight.  Stay in shape.  Be prepared for labor and delivery.  Experiencing pain or cramping in the lower abdomen or low back is a good sign that you should stop exercising. Check with your health care provider before continuing normal exercises.  Try to avoid standing for long periods of time. Move your legs often if you must stand in one place for a long time.  Avoid heavy lifting.  Wear low-heeled shoes, and practice good posture.  You may continue to have sex unless your health care provider directs you  otherwise. Relief of Pain or Discomfort  Wear a good support bra for breast tenderness.   Take warm sitz baths to soothe any pain or discomfort caused by hemorrhoids. Use hemorrhoid cream if your health care provider approves.   Rest with your legs elevated if you have leg cramps or low back pain.  If you develop varicose veins in your legs, wear support hose. Elevate your feet for 15 minutes, 3-4 times a day. Limit salt in your diet. Prenatal Care  Schedule your prenatal visits by the twelfth week of pregnancy. They are usually scheduled monthly at first, then more often in the last 2 months before delivery.  Write down your questions. Take them to your prenatal visits.  Keep all your prenatal visits as directed by your health care provider. Safety  Wear your seat belt at all times when driving.  Make a list of emergency phone numbers, including numbers for family, friends, the hospital, and police and fire departments. General Tips    Ask your health care provider for a referral to a local prenatal education class. Begin classes no later than at the beginning of month 6 of your pregnancy.  Ask for help if you have counseling or nutritional needs during pregnancy. Your health care provider can offer advice or refer you to specialists for help with various needs.  Do not use hot tubs, steam rooms, or saunas.  Do not douche or use tampons or scented sanitary pads.  Do not cross your legs for long periods of time.  Avoid cat litter boxes and soil used by cats. These carry germs that can cause birth defects in the baby and possibly loss of the fetus by miscarriage or stillbirth.  Avoid all smoking, herbs, alcohol, and medicines not prescribed by your health care provider. Chemicals in these affect the formation and growth of the baby.  Schedule a dentist appointment. At home, brush your teeth with a soft toothbrush and be gentle when you floss. SEEK MEDICAL CARE IF:   You have  dizziness.  You have mild pelvic cramps, pelvic pressure, or nagging pain in the abdominal area.  You have persistent nausea, vomiting, or diarrhea.  You have a bad smelling vaginal discharge.  You have pain with urination.  You notice increased swelling in your face, hands, legs, or ankles. SEEK IMMEDIATE MEDICAL CARE IF:   You have a fever.  You are leaking fluid from your vagina.  You have spotting or bleeding from your vagina.  You have severe abdominal cramping or pain.  You have rapid weight gain or loss.  You vomit blood or material that looks like coffee grounds.  You are exposed to German measles and have never had them.  You are exposed to fifth disease or chickenpox.  You develop a severe headache.  You have shortness of breath.  You have any kind of trauma, such as from a fall or a car accident. Document Released: 08/05/2001 Document Revised: 12/26/2013 Document Reviewed: 06/21/2013 [employer] Patient Information 2015 [employer]. This information is not intended to replace advice given to you by your health care provider. Make sure you discuss any questions you have with your health care provider.  Breastfeeding Deciding to breastfeed is one of the best choices you can make for you and your baby. A change in hormones during pregnancy causes your breast tissue to grow and increases the number and size of your milk ducts. These hormones also allow proteins, sugars, and fats from your blood supply to make breast milk in your milk-producing glands. Hormones prevent breast milk from being released before your baby is born as well as prompt milk flow after birth. Once breastfeeding has begun, thoughts of your baby, as well as his or her sucking or crying, can stimulate the release of milk from your milk-producing glands.  BENEFITS OF BREASTFEEDING For Your Baby  Your first milk (colostrum) helps your baby's digestive system function better.   There are antibodies  in your milk that help your baby fight off infections.   Your baby has a lower incidence of asthma, allergies, and sudden infant death syndrome.   The nutrients in breast milk are better for your baby than infant formulas and are designed uniquely for your baby's needs.   Breast milk improves your baby's brain development.   Your baby is less likely to develop other conditions, such as childhood obesity, asthma, or type 2 diabetes mellitus.  For You   Breastfeeding helps to create a very special bond between   you and your baby.   Breastfeeding is convenient. Breast milk is always available at the correct temperature and costs nothing.   Breastfeeding helps to burn calories and helps you lose the weight gained during pregnancy.   Breastfeeding makes your uterus contract to its prepregnancy size faster and slows bleeding (lochia) after you give birth.   Breastfeeding helps to lower your risk of developing type 2 diabetes mellitus, osteoporosis, and breast or ovarian cancer later in life. SIGNS THAT YOUR BABY IS HUNGRY Early Signs of Hunger  Increased alertness or activity.  Stretching.  Movement of the head from side to side.  Movement of the head and opening of the mouth when the corner of the mouth or cheek is stroked (rooting).  Increased sucking sounds, smacking lips, cooing, sighing, or squeaking.  Hand-to-mouth movements.  Increased sucking of fingers or hands. Late Signs of Hunger  Fussing.  Intermittent crying. Extreme Signs of Hunger Signs of extreme hunger will require calming and consoling before your baby will be able to breastfeed successfully. Do not wait for the following signs of extreme hunger to occur before you initiate breastfeeding:   Restlessness.  A loud, strong cry.   Screaming. BREASTFEEDING BASICS Breastfeeding Initiation  Find a comfortable place to sit or lie down, with your neck and back well supported.  Place a pillow or  rolled up blanket under your baby to bring him or her to the level of your breast (if you are seated). Nursing pillows are specially designed to help support your arms and your baby while you breastfeed.  Make sure that your baby's abdomen is facing your abdomen.   Gently massage your breast. With your fingertips, massage from your chest wall toward your nipple in a circular motion. This encourages milk flow. You may need to continue this action during the feeding if your milk flows slowly.  Support your breast with 4 fingers underneath and your thumb above your nipple. Make sure your fingers are well away from your nipple and your baby's mouth.   Stroke your baby's lips gently with your finger or nipple.   When your baby's mouth is open wide enough, quickly bring your baby to your breast, placing your entire nipple and as much of the colored area around your nipple (areola) as possible into your baby's mouth.   More areola should be visible above your baby's upper lip than below the lower lip.   Your baby's tongue should be between his or her lower gum and your breast.   Ensure that your baby's mouth is correctly positioned around your nipple (latched). Your baby's lips should create a seal on your breast and be turned out (everted).  It is common for your baby to suck about 2-3 minutes in order to start the flow of breast milk. Latching Teaching your baby how to latch on to your breast properly is very important. An improper latch can cause nipple pain and decreased milk supply for you and poor weight gain in your baby. Also, if your baby is not latched onto your nipple properly, he or she may swallow some air during feeding. This can make your baby fussy. Burping your baby when you switch breasts during the feeding can help to get rid of the air. However, teaching your baby to latch on properly is still the best way to prevent fussiness from swallowing air while breastfeeding. Signs  that your baby has successfully latched on to your nipple:    Silent tugging or silent   sucking, without causing you pain.   Swallowing heard between every 3-4 sucks.    Muscle movement above and in front of his or her ears while sucking.  Signs that your baby has not successfully latched on to nipple:   Sucking sounds or smacking sounds from your baby while breastfeeding.  Nipple pain. If you think your baby has not latched on correctly, slip your finger into the corner of your baby's mouth to break the suction and place it between your baby's gums. Attempt breastfeeding initiation again. Signs of Successful Breastfeeding Signs from your baby:   A gradual decrease in the number of sucks or complete cessation of sucking.   Falling asleep.   Relaxation of his or her body.   Retention of a small amount of milk in his or her mouth.   Letting go of your breast by himself or herself. Signs from you:  Breasts that have increased in firmness, weight, and size 1-3 hours after feeding.   Breasts that are softer immediately after breastfeeding.  Increased milk volume, as well as a change in milk consistency and color by the fifth day of breastfeeding.   Nipples that are not sore, cracked, or bleeding. Signs That Your Baby is Getting Enough Milk  Wetting at least 3 diapers in a 24-hour period. The urine should be clear and pale yellow by age 5 days.  At least 3 stools in a 24-hour period by age 5 days. The stool should be soft and yellow.  At least 3 stools in a 24-hour period by age 7 days. The stool should be seedy and yellow.  No loss of weight greater than 10% of birth weight during the first 3 days of age.  Average weight gain of 4-7 ounces (113-198 g) per week after age 4 days.  Consistent daily weight gain by age 5 days, without weight loss after the age of 2 weeks. After a feeding, your baby may spit up a small amount. This is common. BREASTFEEDING FREQUENCY AND  DURATION Frequent feeding will help you make more milk and can prevent sore nipples and breast engorgement. Breastfeed when you feel the need to reduce the fullness of your breasts or when your baby shows signs of hunger. This is called "breastfeeding on demand." Avoid introducing a pacifier to your baby while you are working to establish breastfeeding (the first 4-6 weeks after your baby is born). After this time you may choose to use a pacifier. Research has shown that pacifier use during the first year of a baby's life decreases the risk of sudden infant death syndrome (SIDS). Allow your baby to feed on each breast as long as he or she wants. Breastfeed until your baby is finished feeding. When your baby unlatches or falls asleep while feeding from the first breast, offer the second breast. Because newborns are often sleepy in the first few weeks of life, you may need to awaken your baby to get him or her to feed. Breastfeeding times will vary from baby to baby. However, the following rules can serve as a guide to help you ensure that your baby is properly fed:  Newborns (babies 4 weeks of age or younger) may breastfeed every 1-3 hours.  Newborns should not go longer than 3 hours during the day or 5 hours during the night without breastfeeding.  You should breastfeed your baby a minimum of 8 times in a 24-hour period until you begin to introduce solid foods to your baby at around 6   months of age. BREAST MILK PUMPING Pumping and storing breast milk allows you to ensure that your baby is exclusively fed your breast milk, even at times when you are unable to breastfeed. This is especially important if you are going back to work while you are still breastfeeding or when you are not able to be present during feedings. Your lactation consultant can give you guidelines on how long it is safe to store breast milk.  A breast pump is a machine that allows you to pump milk from your breast into a sterile bottle.  The pumped breast milk can then be stored in a refrigerator or freezer. Some breast pumps are operated by hand, while others use electricity. Ask your lactation consultant which type will work best for you. Breast pumps can be purchased, but some hospitals and breastfeeding support groups lease breast pumps on a monthly basis. A lactation consultant can teach you how to hand express breast milk, if you prefer not to use a pump.  CARING FOR YOUR BREASTS WHILE YOU BREASTFEED Nipples can become dry, cracked, and sore while breastfeeding. The following recommendations can help keep your breasts moisturized and healthy:  Avoid using soap on your nipples.   Wear a supportive bra. Although not required, special nursing bras and tank tops are designed to allow access to your breasts for breastfeeding without taking off your entire bra or top. Avoid wearing underwire-style bras or extremely tight bras.  Air dry your nipples for 3-4minutes after each feeding.   Use only cotton bra pads to absorb leaked breast milk. Leaking of breast milk between feedings is normal.   Use lanolin on your nipples after breastfeeding. Lanolin helps to maintain your skin's normal moisture barrier. If you use pure lanolin, you do not need to wash it off before feeding your baby again. Pure lanolin is not toxic to your baby. You may also hand express a few drops of breast milk and gently massage that milk into your nipples and allow the milk to air dry. In the first few weeks after giving birth, some women experience extremely full breasts (engorgement). Engorgement can make your breasts feel heavy, warm, and tender to the touch. Engorgement peaks within 3-5 days after you give birth. The following recommendations can help ease engorgement:  Completely empty your breasts while breastfeeding or pumping. You may want to start by applying warm, moist heat (in the shower or with warm water-soaked hand towels) just before feeding or  pumping. This increases circulation and helps the milk flow. If your baby does not completely empty your breasts while breastfeeding, pump any extra milk after he or she is finished.  Wear a snug bra (nursing or regular) or tank top for 1-2 days to signal your body to slightly decrease milk production.  Apply ice packs to your breasts, unless this is too uncomfortable for you.  Make sure that your baby is latched on and positioned properly while breastfeeding. If engorgement persists after 48 hours of following these recommendations, contact your health care provider or a lactation consultant. OVERALL HEALTH CARE RECOMMENDATIONS WHILE BREASTFEEDING  Eat healthy foods. Alternate between meals and snacks, eating 3 of each per day. Because what you eat affects your breast milk, some of the foods may make your baby more irritable than usual. Avoid eating these foods if you are sure that they are negatively affecting your baby.  Drink milk, fruit juice, and water to satisfy your thirst (about 10 glasses a day).   Rest   often, relax, and continue to take your prenatal vitamins to prevent fatigue, stress, and anemia.  Continue breast self-awareness checks.  Avoid chewing and smoking tobacco.  Avoid alcohol and drug use. Some medicines that may be harmful to your baby can pass through breast milk. It is important to ask your health care provider before taking any medicine, including all over-the-counter and prescription medicine as well as vitamin and herbal supplements. It is possible to become pregnant while breastfeeding. If birth control is desired, ask your health care provider about options that will be safe for your baby. SEEK MEDICAL CARE IF:   You feel like you want to stop breastfeeding or have become frustrated with breastfeeding.  You have painful breasts or nipples.  Your nipples are cracked or bleeding.  Your breasts are red, tender, or warm.  You have a swollen area on either  breast.  You have a fever or chills.  You have nausea or vomiting.  You have drainage other than breast milk from your nipples.  Your breasts do not become full before feedings by the fifth day after you give birth.  You feel sad and depressed.  Your baby is too sleepy to eat well.  Your baby is having trouble sleeping.   Your baby is wetting less than 3 diapers in a 24-hour period.  Your baby has less than 3 stools in a 24-hour period.  Your baby's skin or the white part of his or her eyes becomes yellow.   Your baby is not gaining weight by 5 days of age. SEEK IMMEDIATE MEDICAL CARE IF:   Your baby is overly tired (lethargic) and does not want to wake up and feed.  Your baby develops an unexplained fever. Document Released: 08/11/2005 Document Revised: 08/16/2013 Document Reviewed: 02/02/2013 [employer] Patient Information 2015 [employer]. This information is not intended to replace advice given to you by your health care provider. Make sure you discuss any questions you have with your health care provider.  

## 2014-06-12 NOTE — Progress Notes (Signed)
Doing well--no complaints Order 17P Quad screen next visit

## 2014-06-26 ENCOUNTER — Encounter: Payer: Self-pay | Admitting: Family Medicine

## 2014-07-10 ENCOUNTER — Encounter: Payer: Self-pay | Admitting: Obstetrics & Gynecology

## 2014-07-10 ENCOUNTER — Ambulatory Visit (INDEPENDENT_AMBULATORY_CARE_PROVIDER_SITE_OTHER): Payer: 59 | Admitting: Obstetrics & Gynecology

## 2014-07-10 VITALS — BP 131/72 | HR 79 | Wt 147.0 lb

## 2014-07-10 DIAGNOSIS — O3421 Maternal care for scar from previous cesarean delivery: Secondary | ICD-10-CM

## 2014-07-10 DIAGNOSIS — Z3482 Encounter for supervision of other normal pregnancy, second trimester: Secondary | ICD-10-CM

## 2014-07-10 DIAGNOSIS — O34219 Maternal care for unspecified type scar from previous cesarean delivery: Secondary | ICD-10-CM

## 2014-07-10 NOTE — Patient Instructions (Signed)
Return to clinic for any obstetric concerns or go to [facility] for evaluation  

## 2014-07-10 NOTE — Progress Notes (Signed)
17P to be ordered.  Quad screen today.  Anatomy scan ordered. No other complaints or concerns.  Routine obstetric precautions reviewed.

## 2014-07-12 LAB — AFP, QUAD SCREEN
AFP: 41.5 ng/mL
Curr Gest Age: 15.6 wks.days
Down Syndrome Scr Risk Est: 1:1530 {titer}
HCG TOTAL: 66 [IU]/mL
INH: 240.5 pg/mL
INTERPRETATION-AFP: NEGATIVE
MOM FOR AFP: 1.24
MOM FOR HCG: 1.6
MoM for INH: 1.35
OPEN SPINA BIFIDA: NEGATIVE
Osb Risk: 1:5990 {titer}
TRI 18 SCR RISK EST: NEGATIVE
Trisomy 18 (Edward) Syndrome Interp.: 1:31600 {titer}
UE3 MOM: 0.67
UE3 VALUE: 0.51 ng/mL

## 2014-07-24 ENCOUNTER — Telehealth: Payer: Self-pay | Admitting: *Deleted

## 2014-07-24 NOTE — Telephone Encounter (Signed)
Spoke to patient and she was told that the pharmacy would be contacting her back regarding shipment.  She has not heard anything in a couple of days, I will contact Makena and see what is up and get back with the patient.

## 2014-08-02 ENCOUNTER — Ambulatory Visit (HOSPITAL_COMMUNITY)
Admission: RE | Admit: 2014-08-02 | Discharge: 2014-08-02 | Disposition: A | Discharge status: Home / Self Care | Payer: 59 | Source: Ambulatory Visit | Attending: Obstetrics & Gynecology | Admitting: Obstetrics & Gynecology

## 2014-08-02 ENCOUNTER — Other Ambulatory Visit: Payer: Self-pay | Admitting: Obstetrics & Gynecology

## 2014-08-02 DIAGNOSIS — O09219 Supervision of pregnancy with history of pre-term labor, unspecified trimester: Secondary | ICD-10-CM

## 2014-08-02 DIAGNOSIS — O09212 Supervision of pregnancy with history of pre-term labor, second trimester: Secondary | ICD-10-CM | POA: Insufficient documentation

## 2014-08-02 DIAGNOSIS — O3421 Maternal care for scar from previous cesarean delivery: Secondary | ICD-10-CM | POA: Diagnosis not present

## 2014-08-02 DIAGNOSIS — Z3689 Encounter for other specified antenatal screening: Secondary | ICD-10-CM | POA: Insufficient documentation

## 2014-08-02 DIAGNOSIS — Z3A19 19 weeks gestation of pregnancy: Secondary | ICD-10-CM | POA: Diagnosis not present

## 2014-08-02 DIAGNOSIS — Z36 Encounter for antenatal screening of mother: Secondary | ICD-10-CM | POA: Diagnosis not present

## 2014-08-07 ENCOUNTER — Ambulatory Visit (INDEPENDENT_AMBULATORY_CARE_PROVIDER_SITE_OTHER): Payer: 59 | Admitting: Obstetrics & Gynecology

## 2014-08-07 VITALS — BP 124/76 | HR 83 | Wt 155.0 lb

## 2014-08-07 DIAGNOSIS — O3421 Maternal care for scar from previous cesarean delivery: Secondary | ICD-10-CM

## 2014-08-07 DIAGNOSIS — O09219 Supervision of pregnancy with history of pre-term labor, unspecified trimester: Secondary | ICD-10-CM

## 2014-08-07 DIAGNOSIS — O34219 Maternal care for unspecified type scar from previous cesarean delivery: Secondary | ICD-10-CM

## 2014-08-07 NOTE — Progress Notes (Signed)
Normal quad screen, normal anatomy scan 17P not obtained yet; the manufacturer has been trying to get in contact with her to finalize the process. Patient was informed, says she turned off her phone; will turn it back on and call the company tomorrow. No other complaints or concerns.  Routine obstetric precautions reviewed.

## 2014-08-07 NOTE — Patient Instructions (Signed)
Return to clinic for any obstetric concerns or go to [facility] for evaluation  

## 2014-08-21 ENCOUNTER — Ambulatory Visit (INDEPENDENT_AMBULATORY_CARE_PROVIDER_SITE_OTHER): Payer: 59 | Admitting: *Deleted

## 2014-08-21 DIAGNOSIS — O09212 Supervision of pregnancy with history of pre-term labor, second trimester: Secondary | ICD-10-CM

## 2014-08-21 DIAGNOSIS — Z8751 Personal history of pre-term labor: Secondary | ICD-10-CM

## 2014-08-21 MED ORDER — HYDROXYPROGESTERONE CAPROATE 250 MG/ML IM OIL
250.0000 mg | TOPICAL_OIL | Freq: Once | INTRAMUSCULAR | Status: AC
Start: 2014-08-21 — End: 2014-08-21
  Administered 2014-08-21: 250 mg via INTRAMUSCULAR

## 2014-08-21 NOTE — Progress Notes (Signed)
Patient here today for her first 17P injection.

## 2014-08-28 ENCOUNTER — Other Ambulatory Visit (INDEPENDENT_AMBULATORY_CARE_PROVIDER_SITE_OTHER): Payer: 59 | Admitting: *Deleted

## 2014-08-28 DIAGNOSIS — Z8751 Personal history of pre-term labor: Secondary | ICD-10-CM

## 2014-08-28 MED ORDER — HYDROXYPROGESTERONE CAPROATE 250 MG/ML IM OIL
250.0000 mg | TOPICAL_OIL | Freq: Once | INTRAMUSCULAR | Status: AC
  Administered 2014-08-28: 250 mg via INTRAMUSCULAR

## 2014-08-28 NOTE — Progress Notes (Signed)
Patient here today for her 17P.

## 2014-09-04 ENCOUNTER — Ambulatory Visit (INDEPENDENT_AMBULATORY_CARE_PROVIDER_SITE_OTHER): Payer: 59 | Admitting: Obstetrics and Gynecology

## 2014-09-04 ENCOUNTER — Encounter: Payer: Self-pay | Admitting: Obstetrics and Gynecology

## 2014-09-04 VITALS — BP 136/77 | HR 93 | Wt 161.0 lb

## 2014-09-04 DIAGNOSIS — O09219 Supervision of pregnancy with history of pre-term labor, unspecified trimester: Secondary | ICD-10-CM

## 2014-09-04 DIAGNOSIS — O09212 Supervision of pregnancy with history of pre-term labor, second trimester: Secondary | ICD-10-CM

## 2014-09-04 DIAGNOSIS — O3421 Maternal care for scar from previous cesarean delivery: Secondary | ICD-10-CM

## 2014-09-04 DIAGNOSIS — O34219 Maternal care for unspecified type scar from previous cesarean delivery: Secondary | ICD-10-CM

## 2014-09-04 DIAGNOSIS — Z3482 Encounter for supervision of other normal pregnancy, second trimester: Secondary | ICD-10-CM

## 2014-09-04 MED ORDER — HYDROXYPROGESTERONE CAPROATE 250 MG/ML IM OIL
250.0000 mg | TOPICAL_OIL | Freq: Once | INTRAMUSCULAR | Status: AC
  Administered 2014-09-04: 250 mg via INTRAMUSCULAR

## 2014-09-04 NOTE — Progress Notes (Signed)
Patient is doing well without complaints. Continue weekly 17-P. FM/PTL precautions reviewed. 1hr GCT and labs at next visit

## 2014-09-11 ENCOUNTER — Ambulatory Visit: Payer: 59

## 2014-09-18 ENCOUNTER — Ambulatory Visit (INDEPENDENT_AMBULATORY_CARE_PROVIDER_SITE_OTHER): Payer: 59 | Admitting: *Deleted

## 2014-09-18 DIAGNOSIS — Z8751 Personal history of pre-term labor: Secondary | ICD-10-CM

## 2014-09-18 MED ORDER — HYDROXYPROGESTERONE CAPROATE 250 MG/ML IM OIL
250.0000 mg | TOPICAL_OIL | Freq: Once | INTRAMUSCULAR | Status: AC
  Administered 2014-09-18: 250 mg via INTRAMUSCULAR

## 2014-09-18 NOTE — Progress Notes (Signed)
Pt here today for her 17P injection.  

## 2014-09-25 ENCOUNTER — Ambulatory Visit (INDEPENDENT_AMBULATORY_CARE_PROVIDER_SITE_OTHER): Payer: 59 | Admitting: *Deleted

## 2014-09-25 DIAGNOSIS — Z8751 Personal history of pre-term labor: Secondary | ICD-10-CM

## 2014-09-25 MED ORDER — HYDROXYPROGESTERONE CAPROATE 250 MG/ML IM OIL
250.0000 mg | TOPICAL_OIL | Freq: Once | INTRAMUSCULAR | Status: AC
  Administered 2014-09-25: 250 mg via INTRAMUSCULAR

## 2014-09-25 NOTE — Progress Notes (Signed)
Pt came in today for her 17P.

## 2014-10-02 ENCOUNTER — Ambulatory Visit (INDEPENDENT_AMBULATORY_CARE_PROVIDER_SITE_OTHER): Payer: 59 | Admitting: Obstetrics and Gynecology

## 2014-10-02 ENCOUNTER — Other Ambulatory Visit: Payer: Self-pay | Admitting: Obstetrics and Gynecology

## 2014-10-02 ENCOUNTER — Encounter: Payer: Self-pay | Admitting: Obstetrics and Gynecology

## 2014-10-02 VITALS — BP 133/60 | HR 94 | Wt 170.0 lb

## 2014-10-02 DIAGNOSIS — Z8751 Personal history of pre-term labor: Secondary | ICD-10-CM

## 2014-10-02 DIAGNOSIS — Z36 Encounter for antenatal screening of mother: Secondary | ICD-10-CM

## 2014-10-02 DIAGNOSIS — O3421 Maternal care for scar from previous cesarean delivery: Secondary | ICD-10-CM

## 2014-10-02 DIAGNOSIS — O34219 Maternal care for unspecified type scar from previous cesarean delivery: Secondary | ICD-10-CM

## 2014-10-02 DIAGNOSIS — Z23 Encounter for immunization: Secondary | ICD-10-CM

## 2014-10-02 DIAGNOSIS — O09219 Supervision of pregnancy with history of pre-term labor, unspecified trimester: Secondary | ICD-10-CM

## 2014-10-02 LAB — CBC
HCT: 33.1 % — ABNORMAL LOW (ref 36.0–46.0)
Hemoglobin: 11.2 g/dL — ABNORMAL LOW (ref 12.0–15.0)
MCH: 31.5 pg (ref 26.0–34.0)
MCHC: 33.8 g/dL (ref 30.0–36.0)
MCV: 93.2 fL (ref 78.0–100.0)
MPV: 10.6 fL (ref 8.6–12.4)
Platelets: 243 10*3/uL (ref 150–400)
RBC: 3.55 MIL/uL — AB (ref 3.87–5.11)
RDW: 13.5 % (ref 11.5–15.5)
WBC: 11.5 10*3/uL — ABNORMAL HIGH (ref 4.0–10.5)

## 2014-10-02 MED ORDER — HYDROXYPROGESTERONE CAPROATE 250 MG/ML IM OIL
250.0000 mg | TOPICAL_OIL | Freq: Once | INTRAMUSCULAR | Status: AC
  Administered 2014-10-02: 250 mg via INTRAMUSCULAR

## 2014-10-02 NOTE — Progress Notes (Signed)
Patient is doing well without complaints. FM/PTL precautions discussed. Continue weekly 17-P. 1hr GCT, labs and Tdap today

## 2014-10-02 NOTE — Addendum Note (Signed)
Addended by: Tandy GawHINTON, Melessa Cowell C on: 10/02/2014 03:38 PM   Modules accepted: Orders

## 2014-10-03 LAB — HIV ANTIBODY (ROUTINE TESTING W REFLEX): HIV: NONREACTIVE

## 2014-10-03 LAB — RPR

## 2014-10-04 ENCOUNTER — Inpatient Hospital Stay (HOSPITAL_COMMUNITY)
Admission: AD | Admit: 2014-10-04 | Discharge: 2014-10-04 | Disposition: A | Discharge status: Home / Self Care | Payer: 59 | Source: Ambulatory Visit | Attending: Obstetrics & Gynecology | Admitting: Obstetrics & Gynecology

## 2014-10-04 ENCOUNTER — Encounter (HOSPITAL_COMMUNITY): Payer: Self-pay | Admitting: *Deleted

## 2014-10-04 DIAGNOSIS — O479 False labor, unspecified: Secondary | ICD-10-CM

## 2014-10-04 DIAGNOSIS — Z3A28 28 weeks gestation of pregnancy: Secondary | ICD-10-CM | POA: Diagnosis not present

## 2014-10-04 DIAGNOSIS — O4703 False labor before 37 completed weeks of gestation, third trimester: Secondary | ICD-10-CM

## 2014-10-04 LAB — URINALYSIS, ROUTINE W REFLEX MICROSCOPIC
BILIRUBIN URINE: NEGATIVE
GLUCOSE, UA: NEGATIVE mg/dL
Ketones, ur: NEGATIVE mg/dL
Leukocytes, UA: NEGATIVE
Nitrite: NEGATIVE
PH: 6.5 (ref 5.0–8.0)
PROTEIN: NEGATIVE mg/dL
Specific Gravity, Urine: 1.01 (ref 1.005–1.030)
Urobilinogen, UA: 0.2 mg/dL (ref 0.0–1.0)

## 2014-10-04 LAB — URINE MICROSCOPIC-ADD ON

## 2014-10-04 LAB — FETAL FIBRONECTIN: Fetal Fibronectin: NEGATIVE

## 2014-10-04 NOTE — Discharge Instructions (Signed)
Braxton Hicks Contractions °Contractions of the uterus can occur throughout pregnancy. Contractions are not always a sign that you are in labor.  °WHAT ARE BRAXTON HICKS CONTRACTIONS?  °Contractions that occur before labor are called Braxton Hicks contractions, or false labor. Toward the end of pregnancy (32-34 weeks), these contractions can develop more often and may become more forceful. This is not true labor because these contractions do not result in opening (dilatation) and thinning of the cervix. They are sometimes difficult to tell apart from true labor because these contractions can be forceful and people have different pain tolerances. You should not feel embarrassed if you go to the hospital with false labor. Sometimes, the only way to tell if you are in true labor is for your health care provider to look for changes in the cervix. °If there are no prenatal problems or other health problems associated with the pregnancy, it is completely safe to be sent home with false labor and await the onset of true labor. °HOW CAN YOU TELL THE DIFFERENCE BETWEEN TRUE AND FALSE LABOR? °False Labor °· The contractions of false labor are usually shorter and not as hard as those of true labor.   °· The contractions are usually irregular.   °· The contractions are often felt in the front of the lower abdomen and in the groin.   °· The contractions may go away when you walk around or change positions while lying down.   °· The contractions get weaker and are shorter lasting as time goes on.   °· The contractions do not usually become progressively stronger, regular, and closer together as with true labor.   °True Labor °· Contractions in true labor last 30-70 seconds, become very regular, usually become more intense, and increase in frequency.   °· The contractions do not go away with walking.   °· The discomfort is usually felt in the top of the uterus and spreads to the lower abdomen and low back.   °· True labor can be  determined by your health care provider with an exam. This will show that the cervix is dilating and getting thinner.   °WHAT TO REMEMBER °· Keep up with your usual exercises and follow other instructions given by your health care provider.   °· Take medicines as directed by your health care provider.   °· Keep your regular prenatal appointments.   °· Eat and drink lightly if you think you are going into labor.   °· If Braxton Hicks contractions are making you uncomfortable:   °¨ Change your position from lying down or resting to walking, or from walking to resting.   °¨ Sit and rest in a tub of warm water.   °¨ Drink 2-3 glasses of water. Dehydration may cause these contractions.   °¨ Do slow and deep breathing several times an hour.   °WHEN SHOULD I SEEK IMMEDIATE MEDICAL CARE? °Seek immediate medical care if: °· Your contractions become stronger, more regular, and closer together.   °· You have fluid leaking or gushing from your vagina.   °· You have a fever.   °· You pass blood-tinged mucus.   °· You have vaginal bleeding.   °· You have continuous abdominal pain.   °· You have low back pain that you never had before.   °· You feel your baby's head pushing down and causing pelvic pressure.   °· Your baby is not moving as much as it used to.   °Document Released: 08/11/2005 Document Revised: 08/16/2013 Document Reviewed: 05/23/2013 °[employer]® Patient Information ©2015 [employer]. This information is not intended to replace advice given to you by your health care   provider. Make sure you discuss any questions you have with your health care provider. ° °

## 2014-10-04 NOTE — MAU Provider Note (Signed)
History     CSN: [id number]  Arrival date and time: 10/04/14 1646   None     Chief Complaint  Patient presents with  . Contractions   HPI  Patient is 29 y.o. [id number] here with complaints of contractions.  Started yesterday, felts they occurred regularly but felt more cramping in nature, like braxton hicks.  Has also had some today.  Has significant hx of preterm delivery   +FM, denies LOF, VB, contractions, vaginal discharge.     Past Medical History  Diagnosis Date  . Allergy   . Preterm labor     Past Surgical History  Procedure Laterality Date  . Kidney surgery      age 89  . Cesarean section      X 2 in 2008, 2009  . Appendectomy    . Urethral dilation      Family History  Problem Relation Age of Onset  . COPD Maternal Grandfather     lung  . COPD Paternal Grandmother     lung  . COPD Paternal Grandfather     lung cancer    History  Substance Use Topics  . Smoking status: Never Smoker   . Smokeless tobacco: Never Used  . Alcohol Use: Yes     Comment: rare    Allergies: No Known Allergies  Prescriptions prior to admission  Medication Sig Dispense Refill Last Dose  . hydroxyprogesterone caproate (MAKENA) 250 mg/mL OIL injection Inject 1 mL (250 mg total) into the muscle once. (Patient taking differently: Inject 250 mg into the muscle once a week. ) 0.98 mL 5 10/02/2014  . Prenat-Fe Carbonyl-FA-Omega 3 (ONE-A-DAY WOMENS PRENATAL 1 PO) Take 1 tablet by mouth daily.    Taking    Review of Systems  Constitutional: Negative for fever and chills.  Respiratory: Negative for cough and shortness of breath.   Cardiovascular: Negative for chest pain and leg swelling.  Gastrointestinal: Negative for heartburn, nausea, vomiting and diarrhea.  Genitourinary: Negative for dysuria, urgency, frequency and hematuria.  Neurological:       No headache   Physical Exam   Blood pressure 131/56, pulse 89, temperature 98.1 F (36.7 C), temperature source Oral,  resp. rate 18, last menstrual period 03/21/2014.  Physical Exam  Constitutional: She is oriented to person, place, and time. She appears well-developed and well-nourished.  Very comfortable, in no acute distress   HENT:  Head: Normocephalic and atraumatic.  Eyes: Conjunctivae and EOM are normal.  Neck: Normal range of motion.  Cardiovascular: Normal rate.   Respiratory: Effort normal. No respiratory distress.  GI: Soft. Bowel sounds are normal. She exhibits no distension. There is no tenderness.  Musculoskeletal: Normal range of motion. She exhibits no edema.  Neurological: She is alert and oriented to person, place, and time.  Skin: Skin is warm and dry. No erythema.    Dilation: Closed Exam by:: Dr. Loreta AveAcosta  MAU Course  Procedures  MDM NST reactive FFN neg  Labs: Results for orders placed or performed during the hospital encounter of 10/04/14 (from the past 24 hour(s))  Urinalysis, Routine w reflex microscopic   Collection Time: 10/04/14  5:00 PM  Result Value Ref Range   Color, Urine YELLOW YELLOW   APPearance CLEAR CLEAR   Specific Gravity, Urine 1.010 1.005 - 1.030   pH 6.5 5.0 - 8.0   Glucose, UA NEGATIVE NEGATIVE mg/dL   Hgb urine dipstick TRACE (A) NEGATIVE   Bilirubin Urine NEGATIVE NEGATIVE   Ketones, ur NEGATIVE NEGATIVE  mg/dL   Protein, ur NEGATIVE NEGATIVE mg/dL   Urobilinogen, UA 0.2 0.0 - 1.0 mg/dL   Nitrite NEGATIVE NEGATIVE   Leukocytes, UA NEGATIVE NEGATIVE  Urine microscopic-add on   Collection Time: 10/04/14  5:00 PM  Result Value Ref Range   Squamous Epithelial / LPF RARE RARE   WBC, UA 0-2 <3 WBC/hpf   RBC / HPF 0-2 <3 RBC/hpf   Bacteria, UA FEW (A) RARE  Fetal fibronectin   Collection Time: 10/04/14  6:00 PM  Result Value Ref Range   Fetal Fibronectin NEGATIVE NEGATIVE    Imaging Studies:  No results found.    Assessment and Plan  Patient is 29 y.o. [id number] [redacted]w[redacted]d reporting contractions likely braxton hicks given negative FFN and  closed cervix - fetal kick counts reinforced - preterm labor precautions  Law Corsino ROCIO 10/04/2014, 5:55 PM

## 2014-10-04 NOTE — MAU Note (Signed)
Pt had 17 P injection on Monday, states she has been having U/C's every since injections.  Last night became more frequent, was able to fall asleep but contractions started immediately after awakening this morning at about 0600.  Hx of a PTD at 34 weeks and PTL with second pregnancy.  Denies vaginal bleeding or abnormal discharge.  Good fetal movement.

## 2014-10-06 ENCOUNTER — Telehealth: Payer: Self-pay

## 2014-10-06 LAB — GLUCOSE TOLERANCE, 1 HOUR (50G) W/O FASTING: GLUCOSE 1 HOUR GTT: 151 mg/dL — AB (ref 70–140)

## 2014-10-06 NOTE — Telephone Encounter (Signed)
Marchelle Folksmanda called back, [facility] . Will forward to them to schedule. We discussed need to npo since midnight before. She understands they will call her.

## 2014-10-06 NOTE — Telephone Encounter (Signed)
-----   Message from Catalina AntiguaPeggy Constant, MD sent at 10/06/2014 10:47 AM EST ----- Regarding: needs 3 hr GTT Please inform patient of abnormal 1hr GCT and need for 3 hour test prior to next appointment  Thanks  Peggy

## 2014-10-06 NOTE — Telephone Encounter (Signed)
Attempted to contact patient-- straight to voicemail-- left message stating we are calling with results and to schedule an appointment, please call clinic.

## 2014-10-09 ENCOUNTER — Ambulatory Visit (INDEPENDENT_AMBULATORY_CARE_PROVIDER_SITE_OTHER): Payer: 59 | Admitting: *Deleted

## 2014-10-09 DIAGNOSIS — Z8751 Personal history of pre-term labor: Secondary | ICD-10-CM

## 2014-10-09 MED ORDER — HYDROXYPROGESTERONE CAPROATE 250 MG/ML IM OIL
250.0000 mg | TOPICAL_OIL | Freq: Once | INTRAMUSCULAR | Status: AC
  Administered 2014-10-09: 250 mg via INTRAMUSCULAR

## 2014-10-11 ENCOUNTER — Other Ambulatory Visit (INDEPENDENT_AMBULATORY_CARE_PROVIDER_SITE_OTHER): Payer: 59 | Admitting: *Deleted

## 2014-10-11 DIAGNOSIS — O99810 Abnormal glucose complicating pregnancy: Secondary | ICD-10-CM

## 2014-10-11 DIAGNOSIS — Z3A29 29 weeks gestation of pregnancy: Secondary | ICD-10-CM

## 2014-10-11 DIAGNOSIS — R7309 Other abnormal glucose: Secondary | ICD-10-CM

## 2014-10-11 NOTE — Progress Notes (Signed)
Pt here today for a 3 hr GTT.  

## 2014-10-12 LAB — GLUCOSE TOLERANCE, 3 HOURS
GLUCOSE, 1 HOUR-GESTATIONAL: 142 mg/dL (ref 70–189)
GLUCOSE, 2 HOUR-GESTATIONAL: 100 mg/dL (ref 70–164)
Glucose Tolerance, Fasting: 61 mg/dL — ABNORMAL LOW (ref 70–104)
Glucose, GTT - 3 Hour: 83 mg/dL (ref 70–144)

## 2014-10-16 ENCOUNTER — Ambulatory Visit (INDEPENDENT_AMBULATORY_CARE_PROVIDER_SITE_OTHER): Payer: 59 | Admitting: Obstetrics and Gynecology

## 2014-10-16 ENCOUNTER — Encounter: Payer: Self-pay | Admitting: Obstetrics and Gynecology

## 2014-10-16 VITALS — BP 143/75 | HR 91 | Wt 171.0 lb

## 2014-10-16 DIAGNOSIS — O3421 Maternal care for scar from previous cesarean delivery: Secondary | ICD-10-CM

## 2014-10-16 DIAGNOSIS — O34219 Maternal care for unspecified type scar from previous cesarean delivery: Secondary | ICD-10-CM

## 2014-10-16 DIAGNOSIS — Z8751 Personal history of pre-term labor: Secondary | ICD-10-CM

## 2014-10-16 DIAGNOSIS — O09219 Supervision of pregnancy with history of pre-term labor, unspecified trimester: Secondary | ICD-10-CM

## 2014-10-16 MED ORDER — HYDROXYPROGESTERONE CAPROATE 250 MG/ML IM OIL
250.0000 mg | TOPICAL_OIL | Freq: Once | INTRAMUSCULAR | Status: AC
  Administered 2014-10-16: 250 mg via INTRAMUSCULAR

## 2014-10-16 NOTE — Progress Notes (Signed)
Patient is doing well. Normal 3 hour test. Continue weekly 17-P. FM/PTL precautions reviewed.  BP elevated today without symptoms. Reviewed preeclampsia symptoms. Continue to monitor closely

## 2014-10-23 ENCOUNTER — Ambulatory Visit (INDEPENDENT_AMBULATORY_CARE_PROVIDER_SITE_OTHER): Payer: 59 | Admitting: *Deleted

## 2014-10-23 DIAGNOSIS — Z8751 Personal history of pre-term labor: Secondary | ICD-10-CM

## 2014-10-23 MED ORDER — HYDROXYPROGESTERONE CAPROATE 250 MG/ML IM OIL
250.0000 mg | TOPICAL_OIL | Freq: Once | INTRAMUSCULAR | Status: AC
  Administered 2014-10-23: 250 mg via INTRAMUSCULAR

## 2014-10-23 NOTE — Progress Notes (Signed)
Patient here today for a 17P.

## 2014-10-30 ENCOUNTER — Ambulatory Visit (INDEPENDENT_AMBULATORY_CARE_PROVIDER_SITE_OTHER): Payer: 59 | Admitting: Obstetrics & Gynecology

## 2014-10-30 VITALS — BP 138/79 | HR 92 | Wt 172.0 lb

## 2014-10-30 DIAGNOSIS — O09219 Supervision of pregnancy with history of pre-term labor, unspecified trimester: Secondary | ICD-10-CM

## 2014-10-30 DIAGNOSIS — O3421 Maternal care for scar from previous cesarean delivery: Secondary | ICD-10-CM

## 2014-10-30 DIAGNOSIS — O34219 Maternal care for unspecified type scar from previous cesarean delivery: Secondary | ICD-10-CM

## 2014-10-30 DIAGNOSIS — Z8751 Personal history of pre-term labor: Secondary | ICD-10-CM

## 2014-10-30 MED ORDER — HYDROXYPROGESTERONE CAPROATE 250 MG/ML IM OIL
250.0000 mg | TOPICAL_OIL | Freq: Once | INTRAMUSCULAR | Status: AC
  Administered 2014-10-30: 250 mg via INTRAMUSCULAR

## 2014-10-30 NOTE — Progress Notes (Signed)
Routine visit. Good FM. No problems. Continue 17 P.

## 2014-11-06 ENCOUNTER — Ambulatory Visit (INDEPENDENT_AMBULATORY_CARE_PROVIDER_SITE_OTHER): Payer: 59 | Admitting: *Deleted

## 2014-11-06 DIAGNOSIS — Z8751 Personal history of pre-term labor: Secondary | ICD-10-CM

## 2014-11-06 MED ORDER — HYDROXYPROGESTERONE CAPROATE 250 MG/ML IM OIL
250.0000 mg | TOPICAL_OIL | Freq: Once | INTRAMUSCULAR | Status: AC
  Administered 2014-11-06: 250 mg via INTRAMUSCULAR

## 2014-11-06 NOTE — Progress Notes (Signed)
Patient here today for 17P.

## 2014-11-13 ENCOUNTER — Ambulatory Visit (INDEPENDENT_AMBULATORY_CARE_PROVIDER_SITE_OTHER): Payer: 59 | Admitting: Family Medicine

## 2014-11-13 VITALS — BP 137/77 | HR 89 | Wt 177.0 lb

## 2014-11-13 DIAGNOSIS — O3421 Maternal care for scar from previous cesarean delivery: Secondary | ICD-10-CM

## 2014-11-13 DIAGNOSIS — Z3A34 34 weeks gestation of pregnancy: Secondary | ICD-10-CM

## 2014-11-13 DIAGNOSIS — O26843 Uterine size-date discrepancy, third trimester: Secondary | ICD-10-CM

## 2014-11-13 DIAGNOSIS — O09219 Supervision of pregnancy with history of pre-term labor, unspecified trimester: Secondary | ICD-10-CM

## 2014-11-13 DIAGNOSIS — Z8751 Personal history of pre-term labor: Secondary | ICD-10-CM

## 2014-11-13 DIAGNOSIS — O09213 Supervision of pregnancy with history of pre-term labor, third trimester: Secondary | ICD-10-CM

## 2014-11-13 DIAGNOSIS — O34219 Maternal care for unspecified type scar from previous cesarean delivery: Secondary | ICD-10-CM

## 2014-11-13 MED ORDER — HYDROXYPROGESTERONE CAPROATE 250 MG/ML IM OIL: 250.0000 mg | TOPICAL_OIL | Freq: Once | INTRAMUSCULAR | Status: DC

## 2014-11-13 NOTE — Patient Instructions (Signed)
Third Trimester of Pregnancy The third trimester is from week 29 through week 42, months 7 through 9. The third trimester is a time when the fetus is growing rapidly. At the end of the ninth month, the fetus is about 20 inches in length and weighs 6-10 pounds.  BODY CHANGES Your body goes through many changes during pregnancy. The changes vary from woman to woman.   Your weight will continue to increase. You can expect to gain 25-35 pounds (11-16 kg) by the end of the pregnancy.  You may begin to get stretch marks on your hips, abdomen, and breasts.  You may urinate more often because the fetus is moving lower into your pelvis and pressing on your bladder.  You may develop or continue to have heartburn as a result of your pregnancy.  You may develop constipation because certain hormones are causing the muscles that push waste through your intestines to slow down.  You may develop hemorrhoids or swollen, bulging veins (varicose veins).  You may have pelvic pain because of the weight gain and pregnancy hormones relaxing your joints between the bones in your pelvis. Backaches may result from overexertion of the muscles supporting your posture.  You may have changes in your hair. These can include thickening of your hair, rapid growth, and changes in texture. Some women also have hair loss during or after pregnancy, or hair that feels dry or thin. Your hair will most likely return to normal after your baby is born.  Your breasts will continue to grow and be tender. A yellow discharge may leak from your breasts called colostrum.  Your belly button may stick out.  You may feel short of breath because of your expanding uterus.  You may notice the fetus "dropping," or moving lower in your abdomen.  You may have a bloody mucus discharge. This usually occurs a few days to a week before labor begins.  Your cervix becomes thin and soft (effaced) near your due date. WHAT TO EXPECT AT YOUR  PRENATAL EXAMS  You will have prenatal exams every 2 weeks until week 36. Then, you will have weekly prenatal exams. During a routine prenatal visit:  You will be weighed to make sure you and the fetus are growing normally.  Your blood pressure is taken.  Your abdomen will be measured to track your baby's growth.  The fetal heartbeat will be listened to.  Any test results from the previous visit will be discussed.  You may have a cervical check near your due date to see if you have effaced. At around 36 weeks, your caregiver will check your cervix. At the same time, your caregiver will also perform a test on the secretions of the vaginal tissue. This test is to determine if a type of bacteria, Group B streptococcus, is present. Your caregiver will explain this further. Your caregiver may ask you:  What your birth plan is.  How you are feeling.  If you are feeling the baby move.  If you have had any abnormal symptoms, such as leaking fluid, bleeding, severe headaches, or abdominal cramping.  If you have any questions. Other tests or screenings that may be performed during your third trimester include:  Blood tests that check for low iron levels (anemia).  Fetal testing to check the health, activity level, and growth of the fetus. Testing is done if you have certain medical conditions or if there are problems during the pregnancy. FALSE LABOR You may feel small, irregular contractions that   eventually go away. These are called Braxton Hicks contractions, or false labor. Contractions may last for hours, days, or even weeks before true labor sets in. If contractions come at regular intervals, intensify, or become painful, it is best to be seen by your caregiver.  SIGNS OF LABOR   Menstrual-like cramps.  Contractions that are 5 minutes apart or less.  Contractions that start on the top of the uterus and spread down to the lower abdomen and back.  A sense of increased pelvic  pressure or back pain.  A watery or bloody mucus discharge that comes from the vagina. If you have any of these signs before the 37th week of pregnancy, call your caregiver right away. You need to go to the hospital to get checked immediately. HOME CARE INSTRUCTIONS   Avoid all smoking, herbs, alcohol, and unprescribed drugs. These chemicals affect the formation and growth of the baby.  Follow your caregiver's instructions regarding medicine use. There are medicines that are either safe or unsafe to take during pregnancy.  Exercise only as directed by your caregiver. Experiencing uterine cramps is a good sign to stop exercising.  Continue to eat regular, healthy meals.  Wear a good support bra for breast tenderness.  Do not use hot tubs, steam rooms, or saunas.  Wear your seat belt at all times when driving.  Avoid raw meat, uncooked cheese, cat litter boxes, and soil used by cats. These carry germs that can cause birth defects in the baby.  Take your prenatal vitamins.  Try taking a stool softener (if your caregiver approves) if you develop constipation. Eat more high-fiber foods, such as fresh vegetables or fruit and whole grains. Drink plenty of fluids to keep your urine clear or pale yellow.  Take warm sitz baths to soothe any pain or discomfort caused by hemorrhoids. Use hemorrhoid cream if your caregiver approves.  If you develop varicose veins, wear support hose. Elevate your feet for 15 minutes, 3-4 times a day. Limit salt in your diet.  Avoid heavy lifting, wear low heal shoes, and practice good posture.  Rest a lot with your legs elevated if you have leg cramps or low back pain.  Visit your dentist if you have not gone during your pregnancy. Use a soft toothbrush to brush your teeth and be gentle when you floss.  A sexual relationship may be continued unless your caregiver directs you otherwise.  Do not travel far distances unless it is absolutely necessary and only  with the approval of your caregiver.  Take prenatal classes to understand, practice, and ask questions about the labor and delivery.  Make a trial run to the hospital.  Pack your hospital bag.  Prepare the baby's nursery.  Continue to go to all your prenatal visits as directed by your caregiver. SEEK MEDICAL CARE IF:  You are unsure if you are in labor or if your water has broken.  You have dizziness.  You have mild pelvic cramps, pelvic pressure, or nagging pain in your abdominal area.  You have persistent nausea, vomiting, or diarrhea.  You have a bad smelling vaginal discharge.  You have pain with urination. SEEK IMMEDIATE MEDICAL CARE IF:   You have a fever.  You are leaking fluid from your vagina.  You have spotting or bleeding from your vagina.  You have severe abdominal cramping or pain.  You have rapid weight loss or gain.  You have shortness of breath with chest pain.  You notice sudden or extreme swelling   of your face, hands, ankles, feet, or legs.  You have not felt your baby move in over an hour.  You have severe headaches that do not go away with medicine.  You have vision changes. Document Released: 08/05/2001 Document Revised: 08/16/2013 Document Reviewed: 10/12/2012 [employer] Patient Information 2015 [employer]. This information is not intended to replace advice given to you by your health care provider. Make sure you discuss any questions you have with your health care provider.  Breastfeeding Deciding to breastfeed is one of the best choices you can make for you and your baby. A change in hormones during pregnancy causes your breast tissue to grow and increases the number and size of your milk ducts. These hormones also allow proteins, sugars, and fats from your blood supply to make breast milk in your milk-producing glands. Hormones prevent breast milk from being released before your baby is born as well as prompt milk flow after birth. Once  breastfeeding has begun, thoughts of your baby, as well as his or her sucking or crying, can stimulate the release of milk from your milk-producing glands.  BENEFITS OF BREASTFEEDING For Your Baby  Your first milk (colostrum) helps your baby's digestive system function better.   There are antibodies in your milk that help your baby fight off infections.   Your baby has a lower incidence of asthma, allergies, and sudden infant death syndrome.   The nutrients in breast milk are better for your baby than infant formulas and are designed uniquely for your baby's needs.   Breast milk improves your baby's brain development.   Your baby is less likely to develop other conditions, such as childhood obesity, asthma, or type 2 diabetes mellitus.  For You   Breastfeeding helps to create a very special bond between you and your baby.   Breastfeeding is convenient. Breast milk is always available at the correct temperature and costs nothing.   Breastfeeding helps to burn calories and helps you lose the weight gained during pregnancy.   Breastfeeding makes your uterus contract to its prepregnancy size faster and slows bleeding (lochia) after you give birth.   Breastfeeding helps to lower your risk of developing type 2 diabetes mellitus, osteoporosis, and breast or ovarian cancer later in life. SIGNS THAT YOUR BABY IS HUNGRY Early Signs of Hunger  Increased alertness or activity.  Stretching.  Movement of the head from side to side.  Movement of the head and opening of the mouth when the corner of the mouth or cheek is stroked (rooting).  Increased sucking sounds, smacking lips, cooing, sighing, or squeaking.  Hand-to-mouth movements.  Increased sucking of fingers or hands. Late Signs of Hunger  Fussing.  Intermittent crying. Extreme Signs of Hunger Signs of extreme hunger will require calming and consoling before your baby will be able to breastfeed successfully. Do not  wait for the following signs of extreme hunger to occur before you initiate breastfeeding:   Restlessness.  A loud, strong cry.   Screaming. BREASTFEEDING BASICS Breastfeeding Initiation  Find a comfortable place to sit or lie down, with your neck and back well supported.  Place a pillow or rolled up blanket under your baby to bring him or her to the level of your breast (if you are seated). Nursing pillows are specially designed to help support your arms and your baby while you breastfeed.  Make sure that your baby's abdomen is facing your abdomen.   Gently massage your breast. With your fingertips, massage from your chest   wall toward your nipple in a circular motion. This encourages milk flow. You may need to continue this action during the feeding if your milk flows slowly.  Support your breast with 4 fingers underneath and your thumb above your nipple. Make sure your fingers are well away from your nipple and your baby's mouth.   Stroke your baby's lips gently with your finger or nipple.   When your baby's mouth is open wide enough, quickly bring your baby to your breast, placing your entire nipple and as much of the colored area around your nipple (areola) as possible into your baby's mouth.   More areola should be visible above your baby's upper lip than below the lower lip.   Your baby's tongue should be between his or her lower gum and your breast.   Ensure that your baby's mouth is correctly positioned around your nipple (latched). Your baby's lips should create a seal on your breast and be turned out (everted).  It is common for your baby to suck about 2-3 minutes in order to start the flow of breast milk. Latching Teaching your baby how to latch on to your breast properly is very important. An improper latch can cause nipple pain and decreased milk supply for you and poor weight gain in your baby. Also, if your baby is not latched onto your nipple properly, he or she  may swallow some air during feeding. This can make your baby fussy. Burping your baby when you switch breasts during the feeding can help to get rid of the air. However, teaching your baby to latch on properly is still the best way to prevent fussiness from swallowing air while breastfeeding. Signs that your baby has successfully latched on to your nipple:    Silent tugging or silent sucking, without causing you pain.   Swallowing heard between every 3-4 sucks.    Muscle movement above and in front of his or her ears while sucking.  Signs that your baby has not successfully latched on to nipple:   Sucking sounds or smacking sounds from your baby while breastfeeding.  Nipple pain. If you think your baby has not latched on correctly, slip your finger into the corner of your baby's mouth to break the suction and place it between your baby's gums. Attempt breastfeeding initiation again. Signs of Successful Breastfeeding Signs from your baby:   A gradual decrease in the number of sucks or complete cessation of sucking.   Falling asleep.   Relaxation of his or her body.   Retention of a small amount of milk in his or her mouth.   Letting go of your breast by himself or herself. Signs from you:  Breasts that have increased in firmness, weight, and size 1-3 hours after feeding.   Breasts that are softer immediately after breastfeeding.  Increased milk volume, as well as a change in milk consistency and color by the fifth day of breastfeeding.   Nipples that are not sore, cracked, or bleeding. Signs That Your Baby is Getting Enough Milk  Wetting at least 3 diapers in a 24-hour period. The urine should be clear and pale yellow by age 5 days.  At least 3 stools in a 24-hour period by age 5 days. The stool should be soft and yellow.  At least 3 stools in a 24-hour period by age 7 days. The stool should be seedy and yellow.  No loss of weight greater than 10% of birth weight  during the first 3   days of age.  Average weight gain of 4-7 ounces (113-198 g) per week after age 4 days.  Consistent daily weight gain by age 5 days, without weight loss after the age of 2 weeks. After a feeding, your baby may spit up a small amount. This is common. BREASTFEEDING FREQUENCY AND DURATION Frequent feeding will help you make more milk and can prevent sore nipples and breast engorgement. Breastfeed when you feel the need to reduce the fullness of your breasts or when your baby shows signs of hunger. This is called "breastfeeding on demand." Avoid introducing a pacifier to your baby while you are working to establish breastfeeding (the first 4-6 weeks after your baby is born). After this time you may choose to use a pacifier. Research has shown that pacifier use during the first year of a baby's life decreases the risk of sudden infant death syndrome (SIDS). Allow your baby to feed on each breast as long as he or she wants. Breastfeed until your baby is finished feeding. When your baby unlatches or falls asleep while feeding from the first breast, offer the second breast. Because newborns are often sleepy in the first few weeks of life, you may need to awaken your baby to get him or her to feed. Breastfeeding times will vary from baby to baby. However, the following rules can serve as a guide to help you ensure that your baby is properly fed:  Newborns (babies 4 weeks of age or younger) may breastfeed every 1-3 hours.  Newborns should not go longer than 3 hours during the day or 5 hours during the night without breastfeeding.  You should breastfeed your baby a minimum of 8 times in a 24-hour period until you begin to introduce solid foods to your baby at around 6 months of age. BREAST MILK PUMPING Pumping and storing breast milk allows you to ensure that your baby is exclusively fed your breast milk, even at times when you are unable to breastfeed. This is especially important if you are  going back to work while you are still breastfeeding or when you are not able to be present during feedings. Your lactation consultant can give you guidelines on how long it is safe to store breast milk.  A breast pump is a machine that allows you to pump milk from your breast into a sterile bottle. The pumped breast milk can then be stored in a refrigerator or freezer. Some breast pumps are operated by hand, while others use electricity. Ask your lactation consultant which type will work best for you. Breast pumps can be purchased, but some hospitals and breastfeeding support groups lease breast pumps on a monthly basis. A lactation consultant can teach you how to hand express breast milk, if you prefer not to use a pump.  CARING FOR YOUR BREASTS WHILE YOU BREASTFEED Nipples can become dry, cracked, and sore while breastfeeding. The following recommendations can help keep your breasts moisturized and healthy:  Avoid using soap on your nipples.   Wear a supportive bra. Although not required, special nursing bras and tank tops are designed to allow access to your breasts for breastfeeding without taking off your entire bra or top. Avoid wearing underwire-style bras or extremely tight bras.  Air dry your nipples for 3-4minutes after each feeding.   Use only cotton bra pads to absorb leaked breast milk. Leaking of breast milk between feedings is normal.   Use lanolin on your nipples after breastfeeding. Lanolin helps to maintain your skin's   normal moisture barrier. If you use pure lanolin, you do not need to wash it off before feeding your baby again. Pure lanolin is not toxic to your baby. You may also hand express a few drops of breast milk and gently massage that milk into your nipples and allow the milk to air dry. In the first few weeks after giving birth, some women experience extremely full breasts (engorgement). Engorgement can make your breasts feel heavy, warm, and tender to the touch.  Engorgement peaks within 3-5 days after you give birth. The following recommendations can help ease engorgement:  Completely empty your breasts while breastfeeding or pumping. You may want to start by applying warm, moist heat (in the shower or with warm water-soaked hand towels) just before feeding or pumping. This increases circulation and helps the milk flow. If your baby does not completely empty your breasts while breastfeeding, pump any extra milk after he or she is finished.  Wear a snug bra (nursing or regular) or tank top for 1-2 days to signal your body to slightly decrease milk production.  Apply ice packs to your breasts, unless this is too uncomfortable for you.  Make sure that your baby is latched on and positioned properly while breastfeeding. If engorgement persists after 48 hours of following these recommendations, contact your health care provider or a lactation consultant. OVERALL HEALTH CARE RECOMMENDATIONS WHILE BREASTFEEDING  Eat healthy foods. Alternate between meals and snacks, eating 3 of each per day. Because what you eat affects your breast milk, some of the foods may make your baby more irritable than usual. Avoid eating these foods if you are sure that they are negatively affecting your baby.  Drink milk, fruit juice, and water to satisfy your thirst (about 10 glasses a day).   Rest often, relax, and continue to take your prenatal vitamins to prevent fatigue, stress, and anemia.  Continue breast self-awareness checks.  Avoid chewing and smoking tobacco.  Avoid alcohol and drug use. Some medicines that may be harmful to your baby can pass through breast milk. It is important to ask your health care provider before taking any medicine, including all over-the-counter and prescription medicine as well as vitamin and herbal supplements. It is possible to become pregnant while breastfeeding. If birth control is desired, ask your health care provider about options that  will be safe for your baby. SEEK MEDICAL CARE IF:   You feel like you want to stop breastfeeding or have become frustrated with breastfeeding.  You have painful breasts or nipples.  Your nipples are cracked or bleeding.  Your breasts are red, tender, or warm.  You have a swollen area on either breast.  You have a fever or chills.  You have nausea or vomiting.  You have drainage other than breast milk from your nipples.  Your breasts do not become full before feedings by the fifth day after you give birth.  You feel sad and depressed.  Your baby is too sleepy to eat well.  Your baby is having trouble sleeping.   Your baby is wetting less than 3 diapers in a 24-hour period.  Your baby has less than 3 stools in a 24-hour period.  Your baby's skin or the white part of his or her eyes becomes yellow.   Your baby is not gaining weight by 5 days of age. SEEK IMMEDIATE MEDICAL CARE IF:   Your baby is overly tired (lethargic) and does not want to wake up and feed.  Your baby   develops an unexplained fever. Document Released: 08/11/2005 Document Revised: 08/16/2013 Document Reviewed: 02/02/2013 [employer] Patient Information 2015 [employer]. This information is not intended to replace advice given to you by your health care provider. Make sure you discuss any questions you have with your health care provider.  

## 2014-11-13 NOTE — Progress Notes (Signed)
S>D--will check u/s Continue weekly 17 P CS and BTL scheduled

## 2014-11-16 ENCOUNTER — Ambulatory Visit (HOSPITAL_COMMUNITY)
Admission: RE | Admit: 2014-11-16 | Discharge: 2014-11-16 | Disposition: A | Discharge status: Home / Self Care | Payer: 59 | Source: Ambulatory Visit | Attending: Family Medicine | Admitting: Family Medicine

## 2014-11-16 ENCOUNTER — Other Ambulatory Visit: Payer: Self-pay | Admitting: Family Medicine

## 2014-11-16 DIAGNOSIS — O26849 Uterine size-date discrepancy, unspecified trimester: Secondary | ICD-10-CM | POA: Insufficient documentation

## 2014-11-16 DIAGNOSIS — O34219 Maternal care for unspecified type scar from previous cesarean delivery: Secondary | ICD-10-CM

## 2014-11-16 DIAGNOSIS — O26843 Uterine size-date discrepancy, third trimester: Secondary | ICD-10-CM | POA: Insufficient documentation

## 2014-11-16 DIAGNOSIS — O3421 Maternal care for scar from previous cesarean delivery: Secondary | ICD-10-CM | POA: Diagnosis present

## 2014-11-16 DIAGNOSIS — Z3A34 34 weeks gestation of pregnancy: Secondary | ICD-10-CM | POA: Insufficient documentation

## 2014-11-20 ENCOUNTER — Encounter: Payer: 59 | Admitting: Obstetrics & Gynecology

## 2014-11-27 ENCOUNTER — Encounter: Payer: 59 | Admitting: Obstetrics & Gynecology

## 2014-11-27 ENCOUNTER — Ambulatory Visit (INDEPENDENT_AMBULATORY_CARE_PROVIDER_SITE_OTHER): Payer: 59 | Admitting: Obstetrics & Gynecology

## 2014-11-27 VITALS — BP 111/70 | HR 88 | Wt 177.0 lb

## 2014-11-27 DIAGNOSIS — Z136 Encounter for screening for cardiovascular disorders: Secondary | ICD-10-CM | POA: Diagnosis not present

## 2014-11-27 DIAGNOSIS — O09219 Supervision of pregnancy with history of pre-term labor, unspecified trimester: Secondary | ICD-10-CM

## 2014-11-27 DIAGNOSIS — Z8751 Personal history of pre-term labor: Secondary | ICD-10-CM | POA: Diagnosis not present

## 2014-11-27 NOTE — Patient Instructions (Signed)
Return to clinic for any obstetric concerns or go to [facility] for evaluation  

## 2014-11-27 NOTE — Progress Notes (Signed)
EFW 67% on 11/16/14, normal AFV. Pelvic cultures done today. Continue weekly 17P, will have last injection next week No other complaints or concerns.  Labor and fetal movement precautions reviewed.

## 2014-11-28 MED ORDER — HYDROXYPROGESTERONE CAPROATE 250 MG/ML IM OIL
250.0000 mg | TOPICAL_OIL | Freq: Once | INTRAMUSCULAR | Status: AC
  Administered 2014-11-28: 250 mg via INTRAMUSCULAR

## 2014-11-28 NOTE — Addendum Note (Signed)
Addended by: Tandy GawHINTON, Alonah Lineback C on: 11/28/2014 02:14 PM   Modules accepted: Orders

## 2014-11-29 LAB — GC/CHLAMYDIA PROBE AMP
CT PROBE, AMP APTIMA: NEGATIVE
GC PROBE AMP APTIMA: NEGATIVE

## 2014-11-30 LAB — CULTURE, BETA STREP (GROUP B ONLY)

## 2014-12-04 ENCOUNTER — Ambulatory Visit (INDEPENDENT_AMBULATORY_CARE_PROVIDER_SITE_OTHER): Payer: 59 | Admitting: Obstetrics & Gynecology

## 2014-12-04 VITALS — BP 126/66 | HR 85 | Wt 177.0 lb

## 2014-12-04 DIAGNOSIS — O34219 Maternal care for unspecified type scar from previous cesarean delivery: Secondary | ICD-10-CM

## 2014-12-04 DIAGNOSIS — Z8751 Personal history of pre-term labor: Secondary | ICD-10-CM

## 2014-12-04 DIAGNOSIS — O3421 Maternal care for scar from previous cesarean delivery: Secondary | ICD-10-CM

## 2014-12-04 DIAGNOSIS — Z3483 Encounter for supervision of other normal pregnancy, third trimester: Secondary | ICD-10-CM

## 2014-12-04 DIAGNOSIS — O09219 Supervision of pregnancy with history of pre-term labor, unspecified trimester: Secondary | ICD-10-CM

## 2014-12-04 NOTE — Progress Notes (Signed)
Patient declines last 17P injection today. No other complaints or concerns.  Labor and fetal movement precautions reviewed.

## 2014-12-04 NOTE — Patient Instructions (Signed)
Return to clinic for any obstetric concerns or go to [facility] for evaluation  

## 2014-12-14 ENCOUNTER — Encounter: Payer: Self-pay | Admitting: Obstetrics and Gynecology

## 2014-12-14 ENCOUNTER — Ambulatory Visit (INDEPENDENT_AMBULATORY_CARE_PROVIDER_SITE_OTHER): Payer: 59 | Admitting: Obstetrics and Gynecology

## 2014-12-14 VITALS — BP 120/72 | HR 98 | Wt 180.0 lb

## 2014-12-14 DIAGNOSIS — O09219 Supervision of pregnancy with history of pre-term labor, unspecified trimester: Secondary | ICD-10-CM

## 2014-12-14 DIAGNOSIS — O3421 Maternal care for scar from previous cesarean delivery: Secondary | ICD-10-CM

## 2014-12-14 DIAGNOSIS — O34219 Maternal care for unspecified type scar from previous cesarean delivery: Secondary | ICD-10-CM

## 2014-12-14 NOTE — Progress Notes (Signed)
Patient is doing well and is without complaints. FM/labor precautions reviewed. Patient scheduled for RCS with BTL on 4/26. Risks of surgery reviewed with the patient

## 2014-12-18 ENCOUNTER — Encounter (HOSPITAL_COMMUNITY): Payer: Self-pay

## 2014-12-18 ENCOUNTER — Encounter (HOSPITAL_COMMUNITY)
Admission: RE | Admit: 2014-12-18 | Discharge: 2014-12-18 | Disposition: A | Discharge status: Home / Self Care | Payer: 59 | Source: Ambulatory Visit | Attending: Obstetrics and Gynecology | Admitting: Obstetrics and Gynecology

## 2014-12-18 VITALS — BP 101/74 | HR 92 | Temp 98.6°F | Resp 18 | Ht 62.0 in | Wt 175.0 lb

## 2014-12-18 DIAGNOSIS — O09219 Supervision of pregnancy with history of pre-term labor, unspecified trimester: Secondary | ICD-10-CM

## 2014-12-18 DIAGNOSIS — O34219 Maternal care for unspecified type scar from previous cesarean delivery: Secondary | ICD-10-CM

## 2014-12-18 HISTORY — DX: Headache, unspecified: R51.9

## 2014-12-18 HISTORY — DX: Headache: R51

## 2014-12-18 HISTORY — DX: Adverse effect of unspecified anesthetic, initial encounter: T41.45XA

## 2014-12-18 LAB — CBC
HEMATOCRIT: 34.8 % — AB (ref 36.0–46.0)
HEMOGLOBIN: 11.8 g/dL — AB (ref 12.0–15.0)
MCH: 30.5 pg (ref 26.0–34.0)
MCHC: 33.9 g/dL (ref 30.0–36.0)
MCV: 89.9 fL (ref 78.0–100.0)
PLATELETS: 211 10*3/uL (ref 150–400)
RBC: 3.87 MIL/uL (ref 3.87–5.11)
RDW: 13.1 % (ref 11.5–15.5)
WBC: 9.3 10*3/uL (ref 4.0–10.5)

## 2014-12-18 LAB — TYPE AND SCREEN
ABO/RH(D): O POS
Antibody Screen: NEGATIVE

## 2014-12-18 LAB — ABO/RH: ABO/RH(D): O POS

## 2014-12-18 MED ORDER — DEXTROSE 5 % IV SOLN
2.0000 g | INTRAVENOUS | Status: AC
  Administered 2014-12-19: 2 g via INTRAVENOUS
  Filled 2014-12-18: qty 2

## 2014-12-18 NOTE — Patient Instructions (Signed)
Your procedure is scheduled on:12/19/14  Enter through the Main Entrance at :0730 am Pick up desk phone and dial [phone] and inform us of your arrival.  Please call [phone] if you have any problems the morning of surgery.  Remember: Do not eat food or drink liquids after midnight:tonight Water ok until:5am on Tuesday   You may brush your teeth the morning of surgery.   DO NOT wear jewelry, eye make-up, lipstick,body lotion, or dark fingernail polish.  (Polished toes are ok) You may wear deodorant.  If you are to be admitted after surgery, leave suitcase in car until your room has been assigned. Patients discharged on the day of surgery will not be allowed to drive home. Wear loose fitting, comfortable clothes for your ride home.

## 2014-12-19 ENCOUNTER — Inpatient Hospital Stay (HOSPITAL_COMMUNITY)
Admission: RE | Admit: 2014-12-19 | Discharge: 2014-12-21 | DRG: 766 | Disposition: A | Discharge status: Home / Self Care | Payer: 59 | Source: Ambulatory Visit | Attending: Obstetrics and Gynecology | Admitting: Obstetrics and Gynecology

## 2014-12-19 ENCOUNTER — Inpatient Hospital Stay (HOSPITAL_COMMUNITY): Payer: 59 | Admitting: Anesthesiology

## 2014-12-19 ENCOUNTER — Encounter (HOSPITAL_COMMUNITY): Payer: Self-pay | Admitting: Anesthesiology

## 2014-12-19 ENCOUNTER — Encounter (HOSPITAL_COMMUNITY)
Admission: RE | Disposition: A | Discharge status: Home / Self Care | Payer: Self-pay | Source: Ambulatory Visit | Attending: Obstetrics and Gynecology

## 2014-12-19 DIAGNOSIS — O9952 Diseases of the respiratory system complicating childbirth: Secondary | ICD-10-CM | POA: Diagnosis present

## 2014-12-19 DIAGNOSIS — Z3A39 39 weeks gestation of pregnancy: Secondary | ICD-10-CM | POA: Diagnosis present

## 2014-12-19 DIAGNOSIS — O34219 Maternal care for unspecified type scar from previous cesarean delivery: Secondary | ICD-10-CM

## 2014-12-19 DIAGNOSIS — J309 Allergic rhinitis, unspecified: Secondary | ICD-10-CM | POA: Diagnosis present

## 2014-12-19 DIAGNOSIS — N858 Other specified noninflammatory disorders of uterus: Secondary | ICD-10-CM | POA: Diagnosis present

## 2014-12-19 DIAGNOSIS — Z98891 History of uterine scar from previous surgery: Secondary | ICD-10-CM

## 2014-12-19 DIAGNOSIS — Z302 Encounter for sterilization: Secondary | ICD-10-CM

## 2014-12-19 DIAGNOSIS — O3421 Maternal care for scar from previous cesarean delivery: Secondary | ICD-10-CM | POA: Diagnosis present

## 2014-12-19 DIAGNOSIS — O09219 Supervision of pregnancy with history of pre-term labor, unspecified trimester: Secondary | ICD-10-CM

## 2014-12-19 HISTORY — PX: TUBAL LIGATION: SHX77

## 2014-12-19 SURGERY — Surgical Case
Anesthesia: Spinal | Site: Abdomen

## 2014-12-19 MED ORDER — OXYTOCIN 10 UNIT/ML IJ SOLN
INTRAMUSCULAR | Status: AC
Start: 2014-12-19 — End: 2014-12-19
  Filled 2014-12-19: qty 4

## 2014-12-19 MED ORDER — DIPHENHYDRAMINE HCL 25 MG PO CAPS: 25.0000 mg | ORAL_CAPSULE | ORAL | Status: DC | PRN

## 2014-12-19 MED ORDER — FENTANYL CITRATE (PF) 100 MCG/2ML IJ SOLN: 25.0000 ug | INTRAMUSCULAR | Status: DC | PRN

## 2014-12-19 MED ORDER — OXYCODONE-ACETAMINOPHEN 5-325 MG PO TABS: 1.0000 | ORAL_TABLET | ORAL | Status: DC | PRN

## 2014-12-19 MED ORDER — SENNOSIDES-DOCUSATE SODIUM 8.6-50 MG PO TABS
2.0000 | ORAL_TABLET | ORAL | Status: DC
  Administered 2014-12-20 (×2): 2 via ORAL
  Filled 2014-12-19 (×2): qty 2

## 2014-12-19 MED ORDER — ONDANSETRON HCL 4 MG/2ML IJ SOLN
INTRAMUSCULAR | Status: DC | PRN
  Administered 2014-12-19: 4 mg via INTRAVENOUS

## 2014-12-19 MED ORDER — NALBUPHINE HCL 10 MG/ML IJ SOLN: 5.0000 mg | Freq: Once | INTRAMUSCULAR | Status: AC | PRN

## 2014-12-19 MED ORDER — DIPHENHYDRAMINE HCL 25 MG PO CAPS: 25.0000 mg | ORAL_CAPSULE | Freq: Four times a day (QID) | ORAL | Status: DC | PRN

## 2014-12-19 MED ORDER — LIDOCAINE-EPINEPHRINE (PF) 2 %-1:200000 IJ SOLN
INTRAMUSCULAR | Status: AC
  Filled 2014-12-19: qty 20

## 2014-12-19 MED ORDER — LANOLIN HYDROUS EX OINT: 1.0000 "application " | TOPICAL_OINTMENT | CUTANEOUS | Status: DC | PRN

## 2014-12-19 MED ORDER — BUPIVACAINE IN DEXTROSE 0.75-8.25 % IT SOLN
INTRATHECAL | Status: AC
  Filled 2014-12-19: qty 2

## 2014-12-19 MED ORDER — OXYTOCIN 40 UNITS IN LACTATED RINGERS INFUSION - SIMPLE MED: 62.5000 mL/h | INTRAVENOUS | Status: AC

## 2014-12-19 MED ORDER — FENTANYL CITRATE (PF) 100 MCG/2ML IJ SOLN
INTRAMUSCULAR | Status: DC | PRN
  Administered 2014-12-19: 25 ug via INTRATHECAL

## 2014-12-19 MED ORDER — MORPHINE SULFATE 0.5 MG/ML IJ SOLN
INTRAMUSCULAR | Status: AC
  Filled 2014-12-19: qty 10

## 2014-12-19 MED ORDER — PHENYLEPHRINE 8 MG IN D5W 100 ML (0.08MG/ML) PREMIX OPTIME
INJECTION | INTRAVENOUS | Status: DC | PRN
  Administered 2014-12-19: 60 ug/min via INTRAVENOUS

## 2014-12-19 MED ORDER — 0.9 % SODIUM CHLORIDE (POUR BTL) OPTIME
TOPICAL | Status: DC | PRN
  Administered 2014-12-19: 1000 mL

## 2014-12-19 MED ORDER — SIMETHICONE 80 MG PO CHEW
80.0000 mg | CHEWABLE_TABLET | Freq: Three times a day (TID) | ORAL | Status: DC
  Administered 2014-12-19 – 2014-12-21 (×5): 80 mg via ORAL
  Filled 2014-12-19 (×6): qty 1

## 2014-12-19 MED ORDER — CITRIC ACID-SODIUM CITRATE 334-500 MG/5ML PO SOLN
30.0000 mL | Freq: Once | ORAL | Status: AC
  Administered 2014-12-19: 30 mL via ORAL

## 2014-12-19 MED ORDER — LACTATED RINGERS IV SOLN
Freq: Once | INTRAVENOUS | Status: AC
  Administered 2014-12-19: 08:00:00 via INTRAVENOUS

## 2014-12-19 MED ORDER — PHENYLEPHRINE 8 MG IN D5W 100 ML (0.08MG/ML) PREMIX OPTIME
INJECTION | INTRAVENOUS | Status: AC
  Filled 2014-12-19: qty 100

## 2014-12-19 MED ORDER — FENTANYL CITRATE (PF) 100 MCG/2ML IJ SOLN
INTRAMUSCULAR | Status: AC
  Filled 2014-12-19: qty 2

## 2014-12-19 MED ORDER — ONDANSETRON HCL 4 MG/2ML IJ SOLN: 4.0000 mg | Freq: Three times a day (TID) | INTRAMUSCULAR | Status: DC | PRN

## 2014-12-19 MED ORDER — SCOPOLAMINE 1 MG/3DAYS TD PT72
MEDICATED_PATCH | TRANSDERMAL | Status: DC
Start: 2014-12-19 — End: 2014-12-21
  Administered 2014-12-19: 1.5 mg via TRANSDERMAL
  Filled 2014-12-19: qty 1

## 2014-12-19 MED ORDER — PRENATAL MULTIVITAMIN CH
1.0000 | ORAL_TABLET | Freq: Every day | ORAL | Status: DC
  Administered 2014-12-19 – 2014-12-21 (×3): 1 via ORAL
  Filled 2014-12-19 (×3): qty 1

## 2014-12-19 MED ORDER — PHENYLEPHRINE 40 MCG/ML (10ML) SYRINGE FOR IV PUSH (FOR BLOOD PRESSURE SUPPORT)
PREFILLED_SYRINGE | INTRAVENOUS | Status: AC
  Filled 2014-12-19: qty 10

## 2014-12-19 MED ORDER — MENTHOL 3 MG MT LOZG: 1.0000 | LOZENGE | OROMUCOSAL | Status: DC | PRN

## 2014-12-19 MED ORDER — IBUPROFEN 600 MG PO TABS: 600.0000 mg | ORAL_TABLET | Freq: Four times a day (QID) | ORAL | Status: DC | PRN

## 2014-12-19 MED ORDER — KETOROLAC TROMETHAMINE 30 MG/ML IJ SOLN
30.0000 mg | Freq: Four times a day (QID) | INTRAMUSCULAR | Status: AC | PRN
  Administered 2014-12-19: 30 mg via INTRAMUSCULAR

## 2014-12-19 MED ORDER — OXYCODONE-ACETAMINOPHEN 5-325 MG PO TABS: 2.0000 | ORAL_TABLET | ORAL | Status: DC | PRN

## 2014-12-19 MED ORDER — NALBUPHINE HCL 10 MG/ML IJ SOLN: 5.0000 mg | INTRAMUSCULAR | Status: DC | PRN

## 2014-12-19 MED ORDER — LIDOCAINE HCL (PF) 1 % IJ SOLN
INTRAMUSCULAR | Status: AC
  Filled 2014-12-19: qty 5

## 2014-12-19 MED ORDER — MEPERIDINE HCL 25 MG/ML IJ SOLN: 6.2500 mg | INTRAMUSCULAR | Status: DC | PRN

## 2014-12-19 MED ORDER — ONDANSETRON HCL 4 MG/2ML IJ SOLN
INTRAMUSCULAR | Status: AC
  Filled 2014-12-19: qty 2

## 2014-12-19 MED ORDER — SODIUM BICARBONATE 8.4 % IV SOLN
INTRAVENOUS | Status: AC
  Filled 2014-12-19: qty 50

## 2014-12-19 MED ORDER — NALBUPHINE HCL 10 MG/ML IJ SOLN
5.0000 mg | Freq: Once | INTRAMUSCULAR | Status: AC | PRN
  Administered 2014-12-19: 5 mg via SUBCUTANEOUS

## 2014-12-19 MED ORDER — KETOROLAC TROMETHAMINE 30 MG/ML IJ SOLN
INTRAMUSCULAR | Status: AC
  Filled 2014-12-19: qty 1

## 2014-12-19 MED ORDER — IBUPROFEN 600 MG PO TABS
600.0000 mg | ORAL_TABLET | Freq: Four times a day (QID) | ORAL | Status: DC
  Administered 2014-12-19 – 2014-12-21 (×8): 600 mg via ORAL
  Filled 2014-12-19 (×8): qty 1

## 2014-12-19 MED ORDER — TETANUS-DIPHTH-ACELL PERTUSSIS 5-2.5-18.5 LF-MCG/0.5 IM SUSP: 0.5000 mL | Freq: Once | INTRAMUSCULAR | Status: DC

## 2014-12-19 MED ORDER — DIBUCAINE 1 % RE OINT: 1.0000 "application " | TOPICAL_OINTMENT | RECTAL | Status: DC | PRN

## 2014-12-19 MED ORDER — NALOXONE HCL 1 MG/ML IJ SOLN
1.0000 ug/kg/h | INTRAMUSCULAR | Status: DC | PRN
  Filled 2014-12-19: qty 2

## 2014-12-19 MED ORDER — SODIUM CHLORIDE 0.9 % IJ SOLN: 3.0000 mL | INTRAMUSCULAR | Status: DC | PRN

## 2014-12-19 MED ORDER — WITCH HAZEL-GLYCERIN EX PADS: 1.0000 "application " | MEDICATED_PAD | CUTANEOUS | Status: DC | PRN

## 2014-12-19 MED ORDER — SCOPOLAMINE 1 MG/3DAYS TD PT72
1.0000 | MEDICATED_PATCH | TRANSDERMAL | Status: DC
Start: 2014-12-19 — End: 2014-12-19
  Administered 2014-12-19: 1.5 mg via TRANSDERMAL

## 2014-12-19 MED ORDER — NALBUPHINE HCL 10 MG/ML IJ SOLN
INTRAMUSCULAR | Status: AC
  Administered 2014-12-19: 5 mg via SUBCUTANEOUS
  Filled 2014-12-19: qty 1

## 2014-12-19 MED ORDER — SIMETHICONE 80 MG PO CHEW: 80.0000 mg | CHEWABLE_TABLET | ORAL | Status: DC | PRN

## 2014-12-19 MED ORDER — KETOROLAC TROMETHAMINE 30 MG/ML IJ SOLN: 30.0000 mg | Freq: Four times a day (QID) | INTRAMUSCULAR | Status: AC | PRN

## 2014-12-19 MED ORDER — CITRIC ACID-SODIUM CITRATE 334-500 MG/5ML PO SOLN
ORAL | Status: AC
  Administered 2014-12-19: 30 mL via ORAL
  Filled 2014-12-19: qty 15

## 2014-12-19 MED ORDER — DIPHENHYDRAMINE HCL 50 MG/ML IJ SOLN: 12.5000 mg | INTRAMUSCULAR | Status: DC | PRN

## 2014-12-19 MED ORDER — SIMETHICONE 80 MG PO CHEW
80.0000 mg | CHEWABLE_TABLET | ORAL | Status: DC
  Administered 2014-12-20 (×2): 80 mg via ORAL
  Filled 2014-12-19 (×2): qty 1

## 2014-12-19 MED ORDER — LACTATED RINGERS IV SOLN
INTRAVENOUS | Status: DC | PRN
  Administered 2014-12-19: 10:00:00 via INTRAVENOUS
  Administered 2014-12-19: 1000 mL
  Administered 2014-12-19: 08:00:00 via INTRAVENOUS

## 2014-12-19 MED ORDER — NALOXONE HCL 0.4 MG/ML IJ SOLN: 0.4000 mg | INTRAMUSCULAR | Status: DC | PRN

## 2014-12-19 MED ORDER — ACETAMINOPHEN 325 MG PO TABS: 650.0000 mg | ORAL_TABLET | ORAL | Status: DC | PRN

## 2014-12-19 MED ORDER — LACTATED RINGERS IV SOLN
INTRAVENOUS | Status: DC
  Administered 2014-12-19: 22:00:00 via INTRAVENOUS

## 2014-12-19 MED ORDER — BUPIVACAINE IN DEXTROSE 0.75-8.25 % IT SOLN
INTRATHECAL | Status: DC | PRN
  Administered 2014-12-19: 1.5 mL via INTRATHECAL

## 2014-12-19 MED ORDER — OXYTOCIN 40 UNITS IN LACTATED RINGERS INFUSION - SIMPLE MED
INTRAVENOUS | Status: DC | PRN
  Administered 2014-12-19: 40 [IU] via INTRAVENOUS

## 2014-12-19 MED ORDER — LACTATED RINGERS IV SOLN
INTRAVENOUS | Status: DC | PRN
  Administered 2014-12-19: 10:00:00 via INTRAVENOUS

## 2014-12-19 MED ORDER — MORPHINE SULFATE (PF) 0.5 MG/ML IJ SOLN
INTRAMUSCULAR | Status: DC | PRN
  Administered 2014-12-19: .15 mg via INTRATHECAL

## 2014-12-19 SURGICAL SUPPLY — 29 items
CLAMP CORD UMBIL (MISCELLANEOUS) IMPLANT
CONTAINER PREFILL 10% NBF 15ML (MISCELLANEOUS) IMPLANT
DRAPE SHEET LG 3/4 BI-LAMINATE (DRAPES) IMPLANT
DRSG OPSITE POSTOP 4X10 (GAUZE/BANDAGES/DRESSINGS) ×4 IMPLANT
DURAPREP 26ML APPLICATOR (WOUND CARE) ×4 IMPLANT
ELECT REM PT RETURN 9FT ADLT (ELECTROSURGICAL) ×4
ELECTRODE REM PT RTRN 9FT ADLT (ELECTROSURGICAL) ×2 IMPLANT
EXTRACTOR VACUUM M CUP 4 TUBE (SUCTIONS) IMPLANT
EXTRACTOR VACUUM M CUP 4' TUBE (SUCTIONS)
GLOVE BIOGEL PI IND STRL 6.5 (GLOVE) ×2 IMPLANT
GLOVE BIOGEL PI INDICATOR 6.5 (GLOVE) ×2
GLOVE SURG SS PI 6.0 STRL IVOR (GLOVE) ×4 IMPLANT
GOWN STRL REUS W/TWL LRG LVL3 (GOWN DISPOSABLE) ×8 IMPLANT
KIT ABG SYR 3ML LUER SLIP (SYRINGE) IMPLANT
NEEDLE HYPO 25X5/8 SAFETYGLIDE (NEEDLE) IMPLANT
NS IRRIG 1000ML POUR BTL (IV SOLUTION) ×4 IMPLANT
PACK C SECTION WH (CUSTOM PROCEDURE TRAY) ×4 IMPLANT
PAD ABD 7.5X8 STRL (GAUZE/BANDAGES/DRESSINGS) ×4 IMPLANT
PAD OB MATERNITY 4.3X12.25 (PERSONAL CARE ITEMS) ×4 IMPLANT
RTRCTR C-SECT PINK 25CM LRG (MISCELLANEOUS) IMPLANT
SEPRAFILM MEMBRANE 5X6 (MISCELLANEOUS) IMPLANT
SPONGE GAUZE 4X4 12PLY STER LF (GAUZE/BANDAGES/DRESSINGS) ×8 IMPLANT
STAPLER VISISTAT 35W (STAPLE) IMPLANT
SUT PLAIN 0 NONE (SUTURE) ×4 IMPLANT
SUT VIC AB 0 CT1 36 (SUTURE) ×16 IMPLANT
SUT VIC AB 4-0 KS 27 (SUTURE) ×4 IMPLANT
TAPE CLOTH SURG 4X10 WHT LF (GAUZE/BANDAGES/DRESSINGS) ×4 IMPLANT
TOWEL OR 17X24 6PK STRL BLUE (TOWEL DISPOSABLE) ×4 IMPLANT
TRAY FOLEY CATH SILVER 14FR (SET/KITS/TRAYS/PACK) ×4 IMPLANT

## 2014-12-19 NOTE — Anesthesia Procedure Notes (Signed)
Spinal Patient location during procedure: OR Start time: 12/19/2014 9:11 AM Staffing Anesthesiologist: Mal AmabileFOSTER, Hamid Brookens Performed by: anesthesiologist  Preanesthetic Checklist Completed: patient identified, site marked, surgical consent, pre-op evaluation, timeout performed, IV checked, risks and benefits discussed and monitors and equipment checked Spinal Block Patient position: sitting Prep: site prepped and draped and DuraPrep Patient monitoring: cardiac monitor, continuous pulse ox, blood pressure and heart rate Approach: midline Location: L3-4 Injection technique: catheter Needle Needle type: Tuohy and Sprotte  Needle gauge: 24 G Needle length: 12.7 cm Needle insertion depth: 5 cm Catheter type: closed end flexible Catheter size: 19 g Assessment Sensory level: T4 Additional Notes Epidural space ID'd with 17 ga Touhy needle LOR with air. No heme, CSF or paresthesias. SAB performed through Epidural needle. CSF clear, free flow, no heme or paresthesias. Spinal needle withdrawn and epidural catheter threaded 5 cm into epidural space. Epidural needle withdrawn and sterile dressing applied. Patient placed supine with LUD.

## 2014-12-19 NOTE — Anesthesia Postprocedure Evaluation (Signed)
  Anesthesia Post-op Note  Patient: Loretta CarneAmanda Sanders  Procedure(s) Performed: Procedure(s) with comments: CESAREAN SECTION (N/A) - Tracie S. RNFA confirmed BILATERAL TUBAL LIGATION (Bilateral)  Patient Location: PACU  Anesthesia Type:Spinal and Epidural  Level of Consciousness: awake, alert  and oriented  Airway and Oxygen Therapy: Patient Spontanous Breathing  Post-op Pain: none  Post-op Assessment: Post-op Vital signs reviewed, Patient's Cardiovascular Status Stable, Respiratory Function Stable, Patent Airway, No signs of Nausea or vomiting, Pain level controlled, No headache, No backache, No residual numbness and No residual motor weakness  Post-op Vital Signs: Reviewed and stable  Last Vitals:  Filed Vitals:   12/19/14 1154  BP: 107/58  Pulse: 62  Temp: 36.6 C  Resp: 20    Complications: No apparent anesthesia complications

## 2014-12-19 NOTE — Anesthesia Preprocedure Evaluation (Addendum)
Anesthesia Evaluation  Patient identified by MRN, date of birth, ID band Patient awake    Reviewed: Allergy & Precautions, NPO status , Patient's Chart, lab work & pertinent test results  History of Anesthesia Complications (+) history of anesthetic complications  Airway Mallampati: II  TM Distance: >3 FB Neck ROM: Full    Dental no notable dental hx. (+) Teeth Intact   Pulmonary neg pulmonary ROS,  breath sounds clear to auscultation  Pulmonary exam normal       Cardiovascular negative cardio ROS  Rhythm:Regular Rate:Normal     Neuro/Psych  Headaches, negative neurological ROS  negative psych ROS   GI/Hepatic negative GI ROS, Neg liver ROS,   Endo/Other  Obesity  Renal/GU negative Renal ROS  negative genitourinary   Musculoskeletal negative musculoskeletal ROS (+)   Abdominal (+) + obese,   Peds  Hematology  (+) anemia ,   Anesthesia Other Findings   Reproductive/Obstetrics (+) Pregnancy Previous C/Section x 2 Undesired fertility                           Anesthesia Physical Anesthesia Plan  ASA: II  Anesthesia Plan: Combined Spinal and Epidural   Post-op Pain Management:    Induction:   Airway Management Planned: Natural Airway  Additional Equipment:   Intra-op Plan:   Post-operative Plan:   Informed Consent: I have reviewed the patients History and Physical, chart, labs and discussed the procedure including the risks, benefits and alternatives for the proposed anesthesia with the patient or authorized representative who has indicated his/her understanding and acceptance.     Plan Discussed with: Anesthesiologist, CRNA and Surgeon  Anesthesia Plan Comments:         Anesthesia Quick Evaluation

## 2014-12-19 NOTE — Transfer of Care (Signed)
Immediate Anesthesia Transfer of Care Note  Patient: Loretta Sanders  Procedure(s) Performed: Procedure(s) with comments: CESAREAN SECTION (N/A) - Tracie S. RNFA confirmed BILATERAL TUBAL LIGATION (Bilateral)  Patient Location: PACU  Anesthesia Type:Spinal  Level of Consciousness: awake, alert  and oriented  Airway & Oxygen Therapy: Patient Spontanous Breathing  Post-op Assessment: Report given to RN and Post -op Vital signs reviewed and stable  Post vital signs: Reviewed and stable  Last Vitals:  Filed Vitals:   12/19/14 0742  BP: 107/60  Pulse: 103  Temp: 36.6 C  Resp: 18    Complications: No apparent anesthesia complications

## 2014-12-19 NOTE — Lactation Note (Signed)
This note was copied from the chart of Loretta Sandi Carnemanda Brucato. Lactation Consultation Note  Initial visit made.  Breastfeeding consultation services and support information reviewed and given to mother.  She states newborn has been latching easily and nursing well.  She states she lost her milk supply with last baby at 2 months and first baby at 5 months.  Denies using formula supplementation prior to losing supply.  Instructed on feeding cues and to put baby to breast with cues.  Encouraged to call for concerns/assist prn.  Patient Name: Loretta Sanders LKGMW'NToday's Date: 12/19/2014 Reason for consult: Initial assessment   Maternal Data    Feeding Feeding Type: Breast Fed  LATCH Score/Interventions Latch: Grasps breast easily, tongue down, lips flanged, rhythmical sucking.  Audible Swallowing: A few with stimulation  Type of Nipple: Everted at rest and after stimulation  Comfort (Breast/Nipple): Soft / non-tender     Hold (Positioning): No assistance needed to correctly position infant at breast. Intervention(s): Breastfeeding basics reviewed;Skin to skin  LATCH Score: 9  Lactation Tools Discussed/Used     Consult Status Consult Status: Follow-up Date: 12/20/14 Follow-up type: In-patient    [name], Loretta Pierpoint S 12/19/2014, 8:02 PM

## 2014-12-19 NOTE — H&P (Signed)
LABOR ADMISSION HISTORY AND PHYSICAL  Loretta Sanders is a 29 y.o. female [phone] with IUP at 7853w0d by LMP presenting for repeat c/s with BTL after two previous cesarean sections. She reports +FMs, No LOF, no VB, no blurry vision, headaches or peripheral edema, and RUQ pain.  She plans on breast feeding. She request BTL for birth control.  Dating: By LMP c/w 5364w0d --->  Estimated Date of Delivery: 12/26/14   Prenatal History/Complications:  Past Medical History: Past Medical History  Diagnosis Date  . Allergy   . Preterm labor   . Headache   . Complication of anesthesia 2009    felt pain during last CS    Past Surgical History: Past Surgical History  Procedure Laterality Date  . Kidney surgery      age 89  . Cesarean section      X 2 in 2008, 2009  . Appendectomy    . Urethral dilation      Obstetrical History: OB History    Gravida Para Term Preterm AB TAB SAB Ectopic Multiple Living   3 2 1 1      2       Social History: History   Social History  . Marital Status: Married    Spouse Name: N/A  . Number of Children: N/A  . Years of Education: N/A   Social History Main Topics  . Smoking status: Never Smoker   . Smokeless tobacco: Never Used  . Alcohol Use: Yes     Comment: rare  . Drug Use: No  . Sexual Activity:    Partners: Male    Birth Control/ Protection: None     Comment: pregnant   Other Topics Concern  . None   Social History Narrative    Family History: Family History  Problem Relation Age of Onset  . COPD Maternal Grandfather     lung  . COPD Paternal Grandmother     lung  . COPD Paternal Grandfather     lung cancer    Allergies: No Known Allergies  Prescriptions prior to admission  Medication Sig Dispense Refill Last Dose  . loratadine (CLARITIN) 10 MG tablet Take 10 mg by mouth daily as needed for allergies.   12/18/2014 at Unknown time  . Prenat-Fe Carbonyl-FA-Omega 3 (ONE-A-DAY WOMENS PRENATAL 1 PO) Take 1 tablet by mouth daily.     12/18/2014 at Unknown time  . hydroxyprogesterone caproate (MAKENA) 250 mg/mL OIL injection Inject 1 mL (250 mg total) into the muscle once. (Patient not taking: Reported on 12/07/2014) 0.98 mL 5 Taking     Review of Systems   All systems reviewed and negative except as stated in HPI  Blood pressure 107/60, pulse 103, temperature 97.9 F (36.6 C), temperature source Oral, resp. rate 18, last menstrual period 03/21/2014, SpO2 99 %. General appearance: alert and cooperative Lungs: clear to auscultation bilaterally Heart: regular rate and rhythm Abdomen: soft, non-tender; bowel sounds normal Extremities: Homans sign is negative, no sign of DVT     Prenatal labs: ABO, Rh: --/--/O POS, O POS (04/25 1120) Antibody: NEG (04/25 1120) Rubella:   RPR: Non Reactive (04/25 1120)  HBsAg: NEGATIVE (09/17 1134)  HIV: NONREACTIVE (02/08 1539)  GBS:    1 hr Glucola 151 with normal 3h Genetic screening  Normal Quad Anatomy US normal  Desires Repeat cesarean section and BTS at 39 weeks  Clinic [place]  Dating 7 week clinic scan  Genetic Screen Quad: Negative      Anatomic US Normal  GTT Third trimester: 151 normal 3 hour  TDaP vaccine  2-16  Flu vaccine Declines  GBS neg  Contraception BTL  Baby Food Breast  Circumcision If female, yes  Pediatrician [facility]  Support Person Husband     Prenatal Transfer Tool  Maternal Diabetes: No Genetic Screening: Normal Maternal Ultrasounds/Referrals: Normal Fetal Ultrasounds or other Referrals:  None Maternal Substance Abuse:  No Significant Maternal Medications:  None Significant Maternal Lab Results: None  Results for orders placed or performed during the hospital encounter of 12/18/14 (from the past 24 hour(s))  CBC   Collection Time: 12/18/14 11:20 AM  Result Value Ref Range   WBC 9.3 4.0 - 10.5 K/uL   RBC 3.87 3.87 - 5.11 MIL/uL   Hemoglobin 11.8 (L) 12.0 - 15.0 g/dL   HCT 16.1 (L) 09.6 - 04.5 %   MCV 89.9 78.0 - 100.0 fL    MCH 30.5 26.0 - 34.0 pg   MCHC 33.9 30.0 - 36.0 g/dL   RDW 40.9 81.1 - 91.4 %   Platelets 211 150 - 400 K/uL  RPR   Collection Time: 12/18/14 11:20 AM  Result Value Ref Range   RPR Ser Ql Non Reactive Non Reactive  Type and screen   Collection Time: 12/18/14 11:20 AM  Result Value Ref Range   ABO/RH(D) O POS    Antibody Screen NEG    Sample Expiration 12/21/2014   ABO/Rh   Collection Time: 12/18/14 11:20 AM  Result Value Ref Range   ABO/RH(D) O POS     Patient Active Problem List   Diagnosis Date Noted  . Previous preterm delivery at 39 weeks affecting pregnancy, antepartum 05/11/2014  . Previous cesarean delivery x 2, antepartum 05/11/2014    Assessment: Loretta Sanders is a 29 y.o. [id number] at [redacted]w[redacted]d here for repeat cesarean section after 2 previous with BTL  #MOF: breast #MOC:BTL  The risks of cesarean section discussed with the patient included but were not limited to: bleeding which may require transfusion or reoperation; infection which may require antibiotics; injury to bowel, bladder, ureters or other surrounding organs; injury to the fetus; need for additional procedures including hysterectomy in the event of a life-threatening hemorrhage; placental abnormalities wth subsequent pregnancies, incisional problems, thromboembolic phenomenon and other postoperative/anesthesia complications, risk of ectopic pregnancy. The patient concurred with the proposed plan, giving informed written consent for the procedure.   Patient has been NPO since last night she will remain NPO for procedure. Anesthesia and OR aware. Preoperative prophylactic antibiotics and SCDs ordered on call to the OR.  To OR when ready.     [name] ROCIO 12/19/2014, 7:59 AM

## 2014-12-19 NOTE — Op Note (Signed)
Sandi Carne PROCEDURE DATE: 12/19/2014  PREOPERATIVE DIAGNOSIS: Intrauterine pregnancy at  [redacted]w[redacted]d weeks gestation; previous uterine incision kerr x2 and undesired fertility  POSTOPERATIVE DIAGNOSIS: The same  PROCEDURE:     Cesarean Section and Bilateral Tubal Sterilization using Pomeroy method  SURGEON:  Dr. Gigi Gin Kerri Asche  ASSISTANT: none  INDICATIONS: Loretta Sanders is a 29 y.o. [id number] at [redacted]w[redacted]d scheduled for cesarean section secondary to previous uterine incision kerr x2.  The risks of cesarean section discussed with the patient included but were not limited to: bleeding which may require transfusion or reoperation; infection which may require antibiotics; injury to bowel, bladder, ureters or other surrounding organs; injury to the fetus; need for additional procedures including hysterectomy in the event of a life-threatening hemorrhage; placental abnormalities wth subsequent pregnancies, incisional problems, thromboembolic phenomenon and other postoperative/anesthesia complications. The patient concurred with the proposed plan, giving informed written consent for the procedure.  Patient also desires permanent sterilization. Risks and benefits of procedure discussed with patient including permanence of method, bleeding, infection, injury to surrounding organs and need for additional procedures. Risk failure of 0.5-1% with increased risk of ectopic gestation if pregnancy occurs was also discussed with patient.  FINDINGS:  Viable female infant in cephalic presentation.  Apgars 9 and 9, weight, 7 pounds and 4 ounces.  Clear amniotic fluid.  Intact placenta, three vessel cord.  Normal uterus, fallopian tubes and ovaries bilaterally.  ANESTHESIA:    Spinal INTRAVENOUS FLUIDS:3000 ml ESTIMATED BLOOD LOSS: 700 ml URINE OUTPUT:  150 ml SPECIMENS: Placenta sent to L&D COMPLICATIONS: None immediate  PROCEDURE IN DETAIL:  The patient received intravenous antibiotics and had sequential compression  devices applied to her lower extremities while in the preoperative area.  She was then taken to the operating room where anesthesia was induced and was found to be adequate. A foley catheter was placed into her bladder and attached to Kloe Oates gravity. She was then placed in a dorsal supine position with a leftward tilt, and prepped and draped in a sterile manner. After an adequate timeout was performed, a Pfannenstiel skin incision was made with scalpel and carried through to the underlying layer of fascia. The fascia was incised in the midline and this incision was extended bilaterally using the Mayo scissors. Kocher clamps were applied to the superior aspect of the fascial incision and the underlying rectus muscles were dissected off bluntly. A similar process was carried out on the inferior aspect of the facial incision. The rectus muscles were separated in the midline bluntly and the peritoneum was entered bluntly. The Alexis self-retaining retractor was introduced into the abdominal cavity. Attention was turned to the lower uterine segment where a bladder flap was created, and a transverse hysterotomy was made with a scalpel and extended bilaterally bluntly. The infant was successfully delivered, and cord was clamped and cut and infant was handed over to awaiting neonatology team. Uterine massage was then administered and the placenta delivered intact with three-vessel cord. The uterus was cleared of clot and debris.  The hysterotomy was closed with 0 Vicryl in a running locked fashion, and an imbricating layer was also placed with a 0 Vicryl. Overall, excellent hemostasis was noted. The pelvis copiously irrigated and cleared of all clot and debris. Hemostasis was confirmed on all surfaces.  The patient's left fallopian tube was then identified, brought to the incision, and grasped with a Babcock clamp. The tube was then followed out to the fimbria. The Babcock clamp was then used to grasp the tube  approximately  4 cm from the cornual region. A 3 cm segment of the tube was then ligated with free tie of plain gut suture, transected and excised. Good hemostasis was noted and the tube was returned to the abdomen. The right fallopian tube was then identified to its fimbriated end, ligated, and a 3 cm segment excised in a similar fashion. Excellent hemostasis was noted, and the tube returned to the abdomen.The peritoneum and the muscles were reapproximated using 0 vicryl interrupted stitches. The fascia was then closed using 0 Vicryl in a running locked fashion.  The skin was closed in a subcuticular fashion using 3.0 Vicryl. The patient tolerated the procedure well. Sponge, lap, instrument and needle counts were correct x 2. She was taken to the recovery room in stable condition.    Raylin Winer,PEGGYMD  12/19/2014 10:10 AM

## 2014-12-20 ENCOUNTER — Encounter (HOSPITAL_COMMUNITY): Payer: Self-pay | Admitting: Obstetrics and Gynecology

## 2014-12-20 LAB — CBC
HCT: 32.9 % — ABNORMAL LOW (ref 36.0–46.0)
Hemoglobin: 10.9 g/dL — ABNORMAL LOW (ref 12.0–15.0)
MCH: 30.4 pg (ref 26.0–34.0)
MCHC: 33.1 g/dL (ref 30.0–36.0)
MCV: 91.6 fL (ref 78.0–100.0)
PLATELETS: 186 10*3/uL (ref 150–400)
RBC: 3.59 MIL/uL — AB (ref 3.87–5.11)
RDW: 13.2 % (ref 11.5–15.5)
WBC: 10.6 10*3/uL — ABNORMAL HIGH (ref 4.0–10.5)

## 2014-12-20 LAB — BIRTH TISSUE RECOVERY COLLECTION (PLACENTA DONATION)

## 2014-12-20 NOTE — Progress Notes (Signed)
Subjective: Postpartum Day 1: Cesarean Delivery Patient reports incisional pain and tolerating PO.   Foley just removed this morning, has not had time to void yet Breastfeeding well without problems  Objective: Vital signs in last 24 hours: Temp:  [97.5 F (36.4 C)-98.5 F (36.9 C)] 98.4 F (36.9 C) (04/27 16100632) Pulse Rate:  [56-103] 65 (04/27 0632) Resp:  [16-20] 18 (04/27 [id number]) BP: (94-119)/(26-67) 103/61 mmHg (04/27 0632) SpO2:  [94 %-100 %] 97 % (04/27 54090632)  Physical Exam:  General: alert, cooperative and no distress Lochia: appropriate Uterine Fundus: firm Incision: healing well, no significant drainage, Dressing reinforced DVT Evaluation: No evidence of DVT seen on physical exam. SCDs on   Recent Labs  12/18/14 1120 12/20/14 0616  HGB 11.8* 10.9*  HCT 34.8* 32.9*    Assessment/Plan: Status post Cesarean section. Doing well postoperatively.  Continue current care IV out Advance diet as tolerated Advance activity as tolerated.  [facility] LLCWILLIAMS,Kaheem Halleck 12/20/2014, 6:50 AM

## 2014-12-20 NOTE — Lactation Note (Signed)
This note was copied from the chart of Loretta Sanders Markowicz. Lactation Consultation Note; Baby fussy when I went into room. Dad changing diaper. Baby latched well then off to sleep. Mom reports she has been feeding a lot this morning. Reports no pain with nursing. Experienced BF mom. No questions at present. To call prn.  Patient Name: Loretta Sanders Doan RUEAV'WToday's Date: 12/20/2014 Reason for consult: Follow-up assessment   Maternal Data Formula Feeding for Exclusion: No Has patient been taught Hand Expression?: Yes Does the patient have breastfeeding experience prior to this delivery?: Yes  Feeding Feeding Type: Breast Fed Length of feed: 5 min  LATCH Score/Interventions Latch: Grasps breast easily, tongue down, lips flanged, rhythmical sucking.  Audible Swallowing: None  Type of Nipple: Everted at rest and after stimulation  Comfort (Breast/Nipple): Soft / non-tender     Hold (Positioning): No assistance needed to correctly position infant at breast.  LATCH Score: 8  Lactation Tools Discussed/Used     Consult Status Consult Status: PRN    Pamelia HoitWeeks, Donyel Nester D 12/20/2014, 11:37 AM

## 2014-12-20 NOTE — Anesthesia Postprocedure Evaluation (Signed)
  Anesthesia Post-op Note  Patient: Loretta CarneAmanda Gentry  Procedure(s) Performed: Procedure(s) with comments: CESAREAN SECTION (N/A) - Tracie S. RNFA confirmed BILATERAL TUBAL LIGATION (Bilateral)  Patient Location: Mother/Baby  Anesthesia Type:Spinal  Level of Consciousness: awake, alert , oriented and patient cooperative  Airway and Oxygen Therapy: Patient Spontanous Breathing  Post-op Pain: mild  Post-op Assessment: Post-op Vital signs reviewed, Patient's Cardiovascular Status Stable, Respiratory Function Stable, Patent Airway, No signs of Nausea or vomiting, Adequate PO intake, Pain level controlled, No headache, No backache, No residual numbness and No residual motor weakness  Post-op Vital Signs: Reviewed and stable  Last Vitals:  Filed Vitals:   12/20/14 0632  BP: 103/61  Pulse: 65  Temp: 36.9 C  Resp: 18    Complications: No apparent anesthesia complications

## 2014-12-21 ENCOUNTER — Encounter (HOSPITAL_COMMUNITY): Payer: Self-pay | Admitting: Advanced Practice Midwife

## 2014-12-21 LAB — RPR, QUANT+TP ABS (REFLEX)
Rapid Plasma Reagin, Quant: 1:1 {titer} — ABNORMAL HIGH
T Pallidum Abs: NEGATIVE

## 2014-12-21 LAB — RPR: RPR Ser Ql: REACTIVE — AB

## 2014-12-21 MED ORDER — IBUPROFEN 600 MG PO TABS: 600.0000 mg | ORAL_TABLET | Freq: Four times a day (QID) | ORAL | Status: DC

## 2014-12-21 MED ORDER — OXYCODONE-ACETAMINOPHEN 5-325 MG PO TABS: 1.0000 | ORAL_TABLET | ORAL | Status: DC | PRN

## 2014-12-21 NOTE — Lactation Note (Signed)
This note was copied from the chart of Loretta Sanders. Lactation Consultation Note  Follow up with mom prior to discharge.  Mom c/o some pain with feedings.  Baby is latched on currently and latch is deep but instructed mom to bring baby in closer.  Comfort gels given with instructions.  Mom has a pump at home for prn use.  Lactation outpatient support and services encouraged prn.  Patient Name: Loretta Sanders JXBJY'NToday's Date: 12/21/2014     Maternal Data    Feeding Feeding Type: Breast Fed Length of feed: 10 min  LATCH Score/Interventions                      Lactation Tools Discussed/Used     Consult Status      [name], Loretta Sanders S 12/21/2014, 12:08 PM

## 2014-12-21 NOTE — Discharge Instructions (Signed)

## 2014-12-21 NOTE — Discharge Summary (Signed)
Obstetric Discharge Summary Reason for Admission: cesarean section- scheduled repeat Prenatal Procedures: none Intrapartum Procedures: cesarean: low cervical, transverse and tubal ligation Postpartum Procedures: none Complications-Operative and Postpartum: none HEMOGLOBIN  Date Value Ref Range Status  12/20/2014 10.9* 12.0 - 15.0 g/dL Final   HCT  Date Value Ref Range Status  12/20/2014 32.9* 36.0 - 46.0 % Final   Loretta Sanders is a 29yo [id number] who was scheduled for a repeat C/S and BTL on 4/26 @ 39.0wks. She tolerated the procedure well and by POD#2 is requesting to be discharged. She is breastfeeing and [facility] [place].  Physical Exam:  General: alert, cooperative and no distress  Heart: RRR Lungs: nl effort Lochia: appropriate Uterine Fundus: firm Incision: honeycomb intact; no drainage DVT Evaluation: No evidence of DVT seen on physical exam.  Discharge Diagnoses: Term Pregnancy-delivered  Discharge Information: Date: 12/21/2014 Activity: pelvic rest Diet: routine Medications: PNV, Ibuprofen and Percocet Condition: stable Instructions: refer to practice specific booklet Discharge to: home Follow-up Information    [facility] Schedule an appointment as soon as possible for a visit in 4 weeks.   Specialty:  Obstetrics and Gynecology   Why:  For your postpartum appointment.   Contact information:   [address] [place] [id number] [phone]      Newborn Data: Live born female  Birth Weight: 7 lb 4 oz (3289 g) APGAR: 9, 9  Home with mother.  Cam HaiSHAW, KIMBERLY CNM 12/21/2014, 7:50 AM

## 2016-05-10 IMAGING — US US OB COMP +14 WK
1 series · 12 of 28 positions shown · non-contrast
Comparison: none

[Series 1: us ob detail +14 wk · 88 acquisitions, 12 frames shown]
[im 4/88]
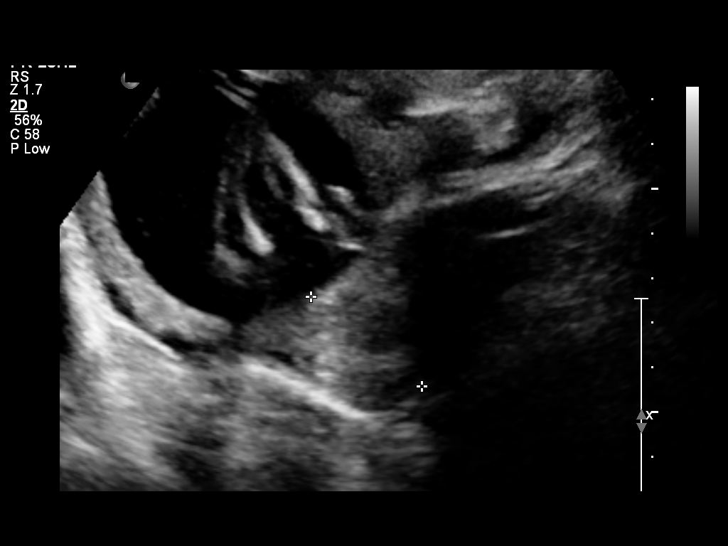
[im 10/88]
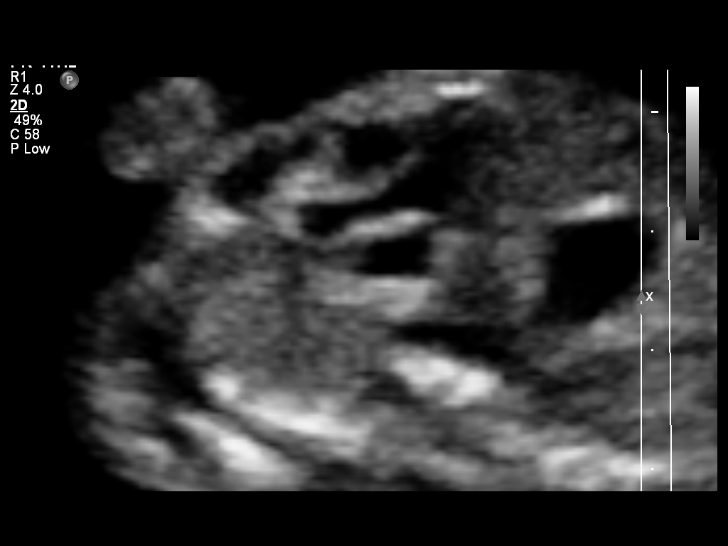
[im 17/88]
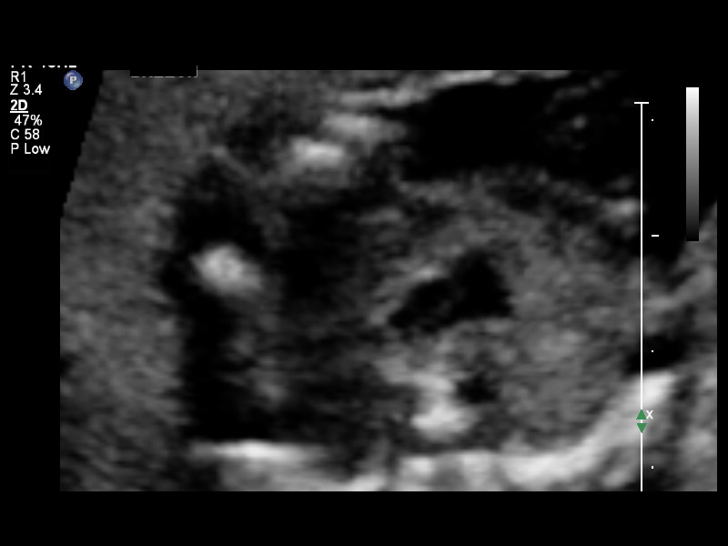
[im 26/88]
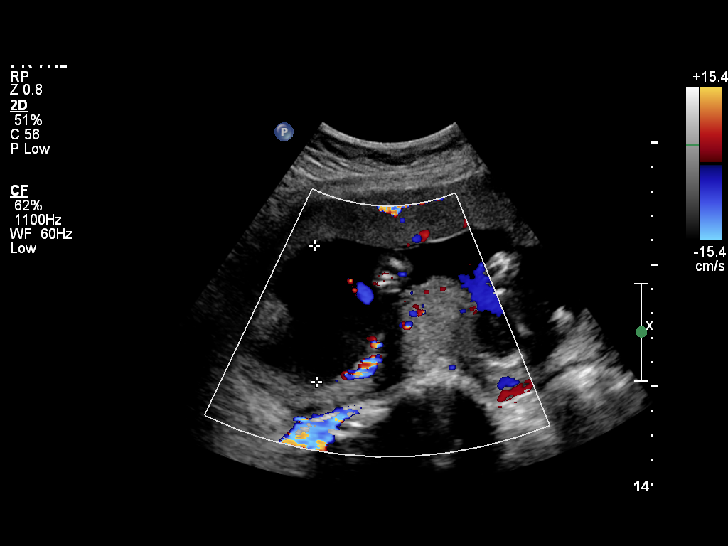
[im 33/88]
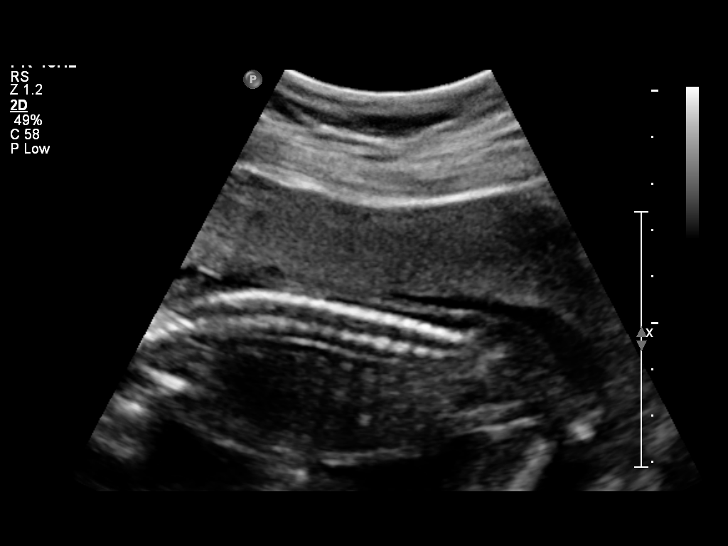
[im 39/88]
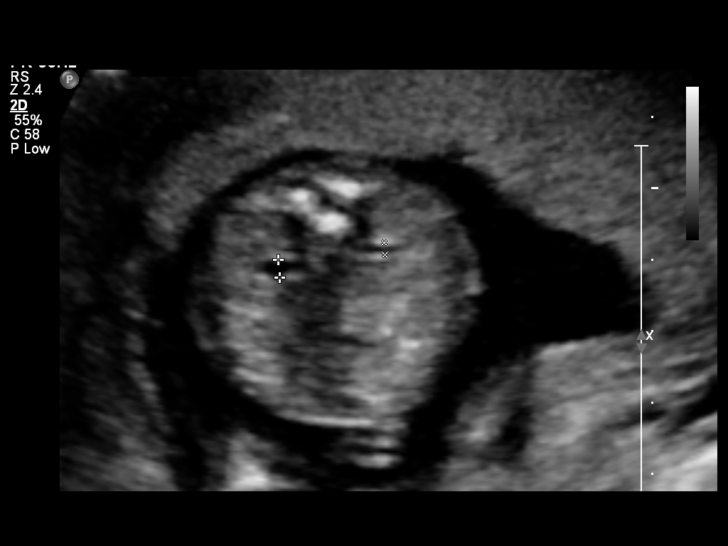
[im 49/88]
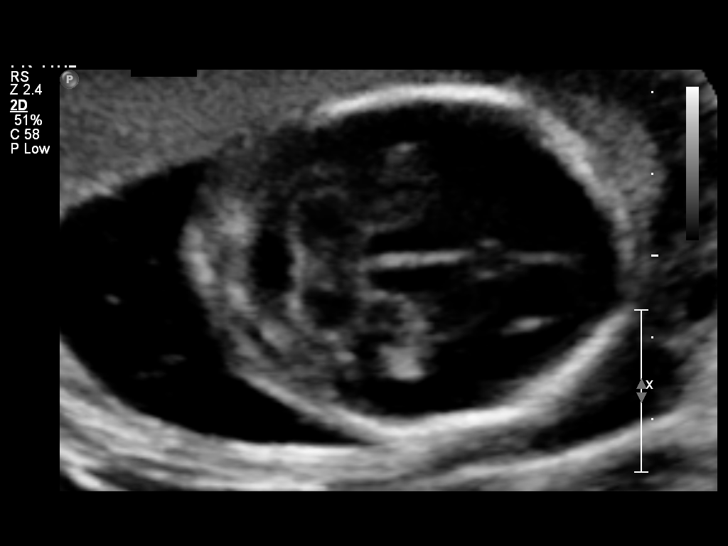
[im 55/88]
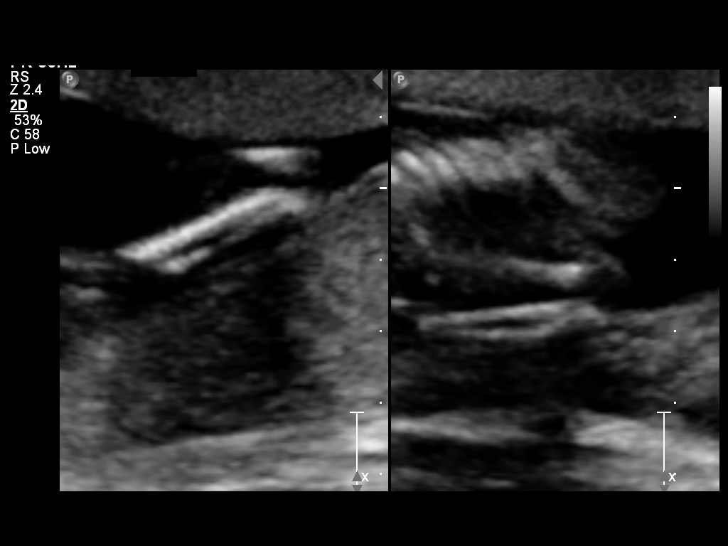
[im 62/88]
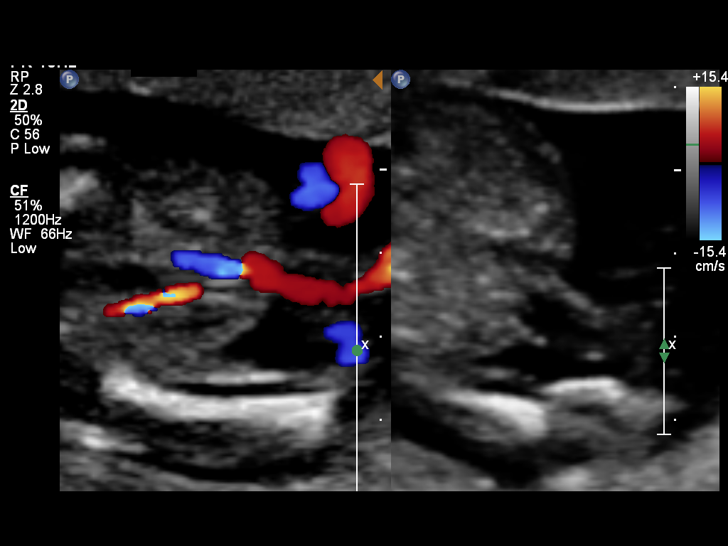
[im 71/88]
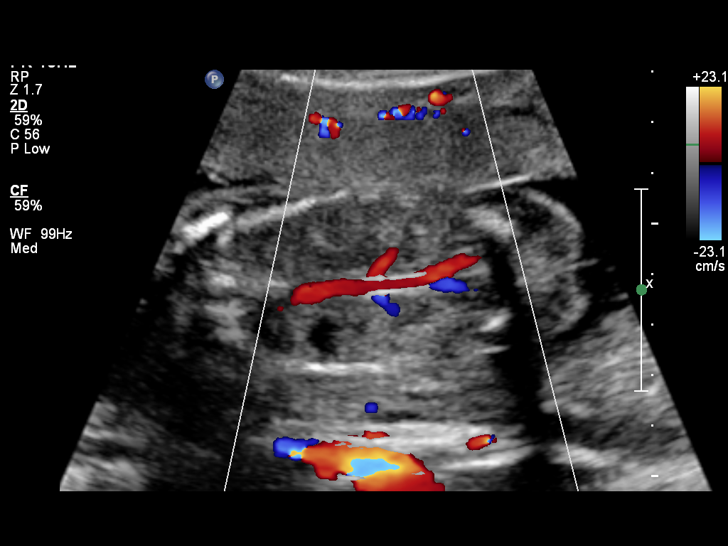
[im 78/88]
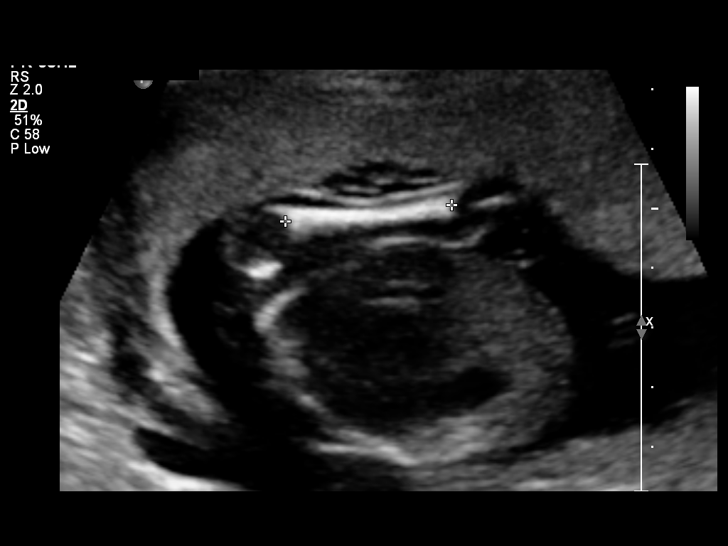
[im 84/88]
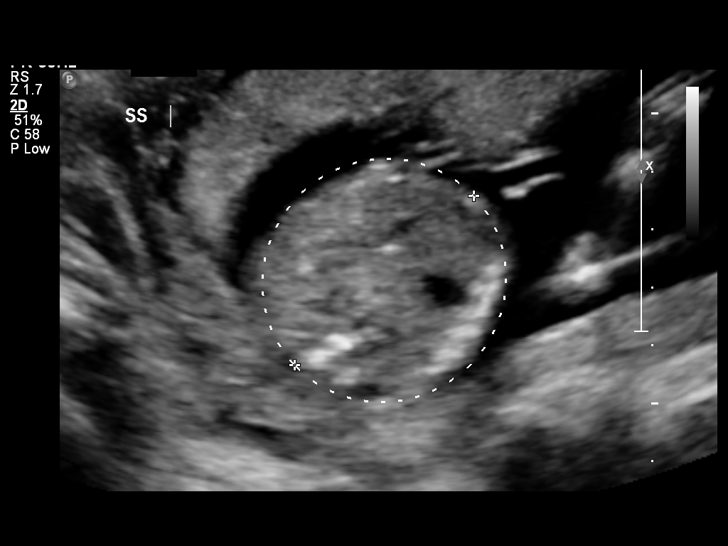

[12 of 28 positions shown; findings below may reference images not displayed]

OBSTETRICS REPORT
                      (Signed Final 08/02/2014 [DATE])

Service(s) Provided

 US OB COMP + 14 WK                                    76805.1
Indications

 Basic anatomic survey                                 z36
 Poor obstetric history (prior pre-term labor x 2
 pregnancies)
 Poor obstetric history: Previous preterm delivery
 (34 weeks)
 Previous cesarean section x 2
Fetal Evaluation

 Num Of Fetuses:    1
 Fetal Heart Rate:  158                          bpm
 Cardiac Activity:  Observed
 Presentation:      Breech
 Placenta:          Anterior, above cervical os
 P. Cord            Visualized, central
 Insertion:

 Amniotic Fluid
 AFI FV:      Subjectively within normal limits
                                             Larg Pckt:    5.58  cm
Biometry

 BPD:     40.2  mm     G. Age:  18w 1d                CI:        65.82   70 - 86
                                                      FL/HC:      17.5   16.1 -

 HC:       159  mm     G. Age:  18w 6d       25  %    HC/AC:      1.22   1.09 -

 AC:     130.5  mm     G. Age:  18w 4d       28  %    FL/BPD:
 FL:      27.9  mm     G. Age:  18w 4d       23  %    FL/AC:      21.4   20 - 24
 HUM:     27.9  mm     G. Age:  19w 0d       46  %
 CER:     18.9  mm     G. Age:  18w 3d       30  %
 NFT:     4.81  mm

 Est. FW:     246  gm      0 lb 9 oz     34  %
Gestational Age

 LMP:           19w 1d        Date:  03/21/14                 EDD:   12/26/14
 U/S Today:     18w 4d                                        EDD:   12/30/14
 Best:          19w 1d     Det. By:  LMP  (03/21/14)          EDD:   12/26/14
Anatomy

 Cranium:          Appears normal         Aortic Arch:      Appears normal
 Fetal Cavum:      Appears normal         Ductal Arch:      Appears normal
 Ventricles:       Appears normal         Diaphragm:        Appears normal
 Choroid Plexus:   Appears normal         Stomach:          Appears normal, left
                                                            sided
 Cerebellum:       Appears normal         Abdomen:          Appears normal
 Posterior Fossa:  Appears normal         Abdominal Wall:   Appears nml (cord
                                                            insert, abd wall)
 Nuchal Fold:      Appears normal         Cord Vessels:     Appears normal (3
                                                            vessel cord)
 Face:             Appears normal         Kidneys:          Appear normal
                   (orbits and profile)
 Lips:             Appears normal         Bladder:          Appears normal
 Heart:            Appears normal         Spine:            Appears normal
                   (4CH, axis, and
                   situs)
 RVOT:             Appears normal         Lower             Appears normal
                                          Extremities:
 LVOT:             Appears normal         Upper             Appears normal
                                          Extremities:

 Other:  Female gender. Heels visualized. Nasal bone visualized.
Targeted Anatomy

 Fetal Central Nervous System
 Lat. Ventricles:  7.0                    Cisterna Magna:
Cervix Uterus Adnexa

 Cervical Length:    3.1      cm

 Cervix:       Normal appearance by transabdominal scan.
 Uterus:       No abnormality visualized.
 Cul De Sac:   No free fluid seen.

 Left Ovary:    Within normal limits.
 Right Ovary:   Within normal limits.
 Adnexa:     No abnormality visualized.
Impression

 Single IUP at 19w 1d
 Normal fetal anatomic survey
 No markers associated with aneuploidy noted
 Normal cervical length (3.1 cm, transabdominal)
 Anterior placenta without previa
 Normal amniotic fluid volume
Recommendations

 Follow-up ultrasounds as clinically indicated.

 questions or concerns.

## 2016-07-03 ENCOUNTER — Ambulatory Visit: Payer: 59 | Admitting: Family Medicine

## 2016-07-08 ENCOUNTER — Telehealth: Payer: Self-pay | Admitting: Behavioral Health

## 2016-07-08 NOTE — Telephone Encounter (Signed)
Attempted to reach patient for Pre-Visit Call. Per recording, the voice mailbox has not been set up; unable to leave message at this time.

## 2016-07-09 ENCOUNTER — Ambulatory Visit (INDEPENDENT_AMBULATORY_CARE_PROVIDER_SITE_OTHER): Payer: 59 | Admitting: Family Medicine

## 2016-07-09 ENCOUNTER — Encounter: Payer: Self-pay | Admitting: Family Medicine

## 2016-07-09 VITALS — BP 112/64 | HR 84 | Temp 98.0°F | Ht 62.0 in | Wt 158.0 lb

## 2016-07-09 DIAGNOSIS — R5383 Other fatigue: Secondary | ICD-10-CM

## 2016-07-09 DIAGNOSIS — E663 Overweight: Secondary | ICD-10-CM | POA: Diagnosis not present

## 2016-07-09 DIAGNOSIS — R6889 Other general symptoms and signs: Secondary | ICD-10-CM

## 2016-07-09 LAB — COMPREHENSIVE METABOLIC PANEL
ALK PHOS: 61 U/L (ref 39–117)
ALT: 12 U/L (ref 0–35)
AST: 19 U/L (ref 0–37)
Albumin: 4.4 g/dL (ref 3.5–5.2)
BUN: 11 mg/dL (ref 6–23)
CO2: 27 mEq/L (ref 19–32)
Calcium: 9.6 mg/dL (ref 8.4–10.5)
Chloride: 104 mEq/L (ref 96–112)
Creatinine, Ser: 0.66 mg/dL (ref 0.40–1.20)
GFR: 110.44 mL/min (ref >60.00)
Glucose, Bld: 88 mg/dL (ref 70–99)
POTASSIUM: 3.6 meq/L (ref 3.5–5.1)
Sodium: 140 mEq/L (ref 135–145)
Total Bilirubin: 0.3 mg/dL (ref 0.2–1.2)
Total Protein: 7.1 g/dL (ref 6.0–8.3)

## 2016-07-09 LAB — VITAMIN D 25 HYDROXY (VIT D DEFICIENCY, FRACTURES): VITD: 26.86 ng/mL — AB (ref 30.00–100.00)

## 2016-07-09 LAB — TSH: TSH: 1.18 u[IU]/mL (ref 0.35–4.50)

## 2016-07-09 LAB — T4, FREE: FREE T4: 0.71 ng/dL (ref 0.60–1.60)

## 2016-07-09 NOTE — Patient Instructions (Signed)
Have your husband let you know if you are snoring at night.  Cancel your appointment if you are content with your symptoms.

## 2016-07-09 NOTE — Progress Notes (Signed)
Chief Complaint  Patient presents with  . Establish Care    Pt states she would like to have her thyroid checked/ Family history of hypotyroidism       New Patient Visit SUBJECTIVE: HPI: Loretta Sanders is an 30 y.o.female who is being seen for establishing care.  Thyroid Difficulty with weight, cold intolerance, dry skin, migraines, bowel movements twice weekly, fatigue, intermittent joint pain, and +famhx of low thyroid (unsure of etiology). Denies swelling, hair issues, palpitations, or diarrhea. She has never been told she has low thyroid and has never been tested. She denies any masses in the neck, neck surgery, or difficulty swallowing. She is unsure if she snores at night. Does wake up with a dry throat at times and feeling as if she has not slept at all. Does have some anxiety symptoms, but does not feel that she has a depressed mood. Of note, she does have 3 children, the youngest being 5818 mo old. Also of note, she has never had an abnormal delivery where there was a concern with bleeding.  No Known Allergies  Past Medical History:  Diagnosis Date  . Allergy   . Complication of anesthesia 2009   felt pain during last CS  . Headache   . Preterm labor    Past Surgical History:  Procedure Laterality Date  . APPENDECTOMY    . CESAREAN SECTION     X 2 in 2008, 2009  . CESAREAN SECTION WITH BILATERAL TUBAL LIGATION N/A 12/19/2014   Procedure: CESAREAN SECTION;  Surgeon: Catalina AntiguaPeggy Constant, MD;  Location: [facility];  Service: Obstetrics;  Laterality: N/A;  Tracie S. RNFA confirmed  . KIDNEY SURGERY     age 89  . TUBAL LIGATION Bilateral 12/19/2014   Procedure: BILATERAL TUBAL LIGATION;  Surgeon: Catalina AntiguaPeggy Constant, MD;  Location: [facility];  Service: Obstetrics;  Laterality: Bilateral;  . URETHRAL DILATION     Social History   Social History  . Marital status: Married   Social History Main Topics  . Smoking status: Never Smoker  . Smokeless tobacco: Never Used  . Alcohol use Yes   Comment: rare  . Drug use: No  . Sexual activity: Yes    Partners: Male    Birth control/ protection: None     Comment: pregnant   Family History  Problem Relation Age of Onset  . COPD Maternal Grandfather     lung  . COPD Paternal Grandmother     lung  . COPD Paternal Grandfather     lung cancer     Current Outpatient Prescriptions:  .  loratadine (CLARITIN) 10 MG tablet, Take 10 mg by mouth daily as needed for allergies., Disp: , Rfl:  .  Prenat-Fe Carbonyl-FA-Omega 3 (ONE-A-DAY WOMENS PRENATAL 1 PO), Take 1 tablet by mouth daily. , Disp: , Rfl:   Patient's last menstrual period was 07/02/2016.  ROS Cardiovascular: Denies chest pain  Endo: As noted in HPI   OBJECTIVE: BP 112/64 (BP Location: Left Arm, Patient Position: Sitting, Cuff Size: Small)   Pulse 84   Temp 98 F (36.7 C) (Oral)   Ht 5\' 2"  (1.575 m)   Wt 158 lb (71.7 kg)   LMP 07/02/2016   SpO2 98%   BMI 28.90 kg/m   Constitutional: -  VS reviewed -  Well developed, well nourished, appears stated age -  No apparent distress  Psychiatric: -  Oriented to person, place, and time -  Memory intact -  Affect and mood normal -  Fluent  conversation, good eye contact -  Judgment and insight age appropriate  Eye: -  Conjunctivae clear, no discharge -  Pupils symmetric, round, reactive to light  ENMT: -  Oral mucosa without lesions, tongue and uvula midline    Tonsils not enlarged, no erythema, no exudate, trachea midline    Pharynx moist, no lesions, no erythema  Neck: -  No gross swelling, no palpable masses -  Thyroid midline, not enlarged, mobile, no palpable masses  Cardiovascular: -  RRR, no murmurs -  No LE edema  Respiratory: -  Normal respiratory effort, no accessory muscle use, no retraction -  Breath sounds equal, no wheezes, no ronchi, no crackles  Gastrointestinal: -  Bowel sounds normal -  No tenderness, no distention, no guarding, no masses  Musculoskeletal: -  No clubbing, no cyanosis -  Gait  normal  Skin: -  No significant lesion on inspection -  Warm and dry to palpation   ASSESSMENT/PLAN: Fatigue, unspecified type - Plan: Vitamin D (25 hydroxy), TSH, T4, free  Cold intolerance - Plan: TSH, T4, free  Overweight (BMI 25.0-29.9) - Plan: Comprehensive metabolic panel  Patient instructed to sign release of records form from her previous PCP. Orders as above. Ask husband to see if she snores at night. Think about mood issues as another common cause of fatigue. Patient should return in 6 weeks to re-evaluate symptoms-sooner if she has any abnormal labs. The patient voiced understanding and agreement to the plan.   Jilda Rocheicholas Paul HawleyWendling, DO 07/09/16  2:31 PM

## 2016-07-09 NOTE — Progress Notes (Signed)
Pre visit review using our clinic review tool, if applicable. No additional management support is needed unless otherwise documented below in the visit note. 

## 2016-08-24 IMAGING — US US OB FOLLOW-UP
1 series · 12 of 28 positions shown · non-contrast
Comparison: none

[Series 1: us ob follow up · 60 acquisitions, 12 frames shown]
[im 3/60]
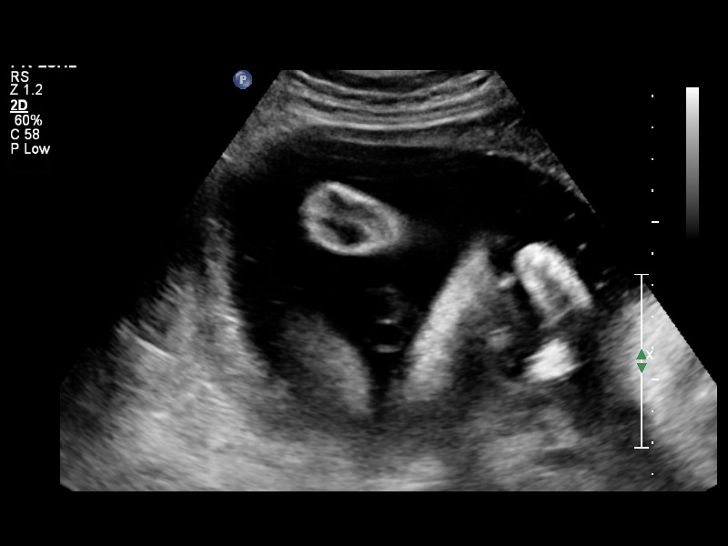
[im 7/60]
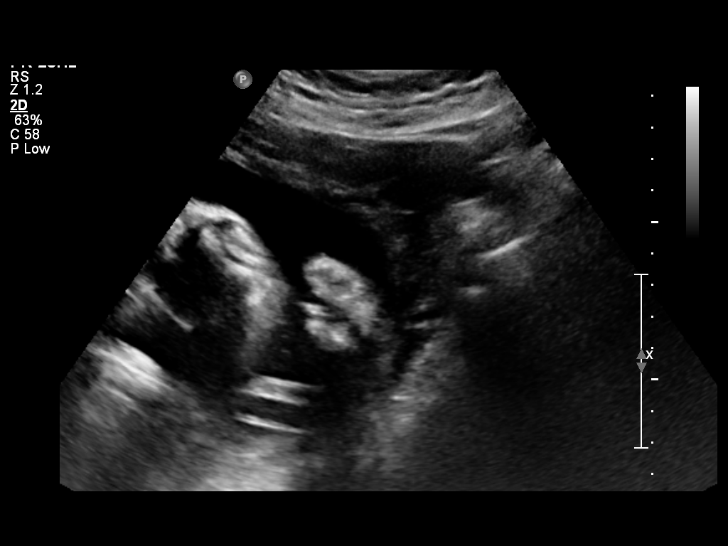
[im 11/60]
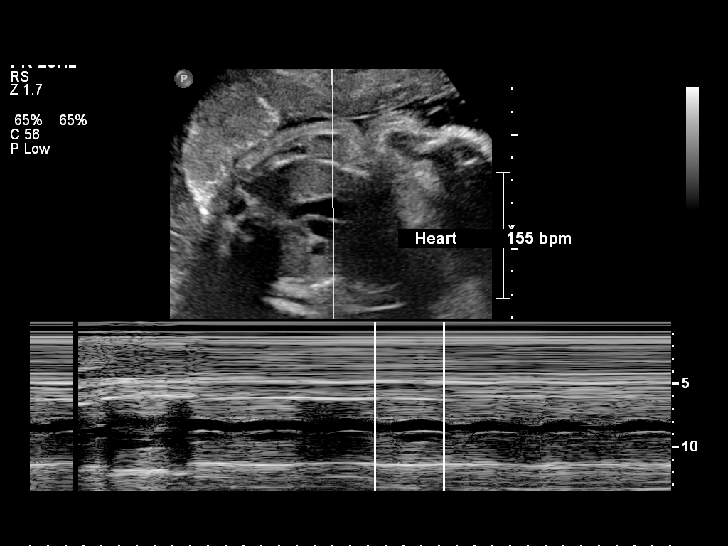
[im 18/60]
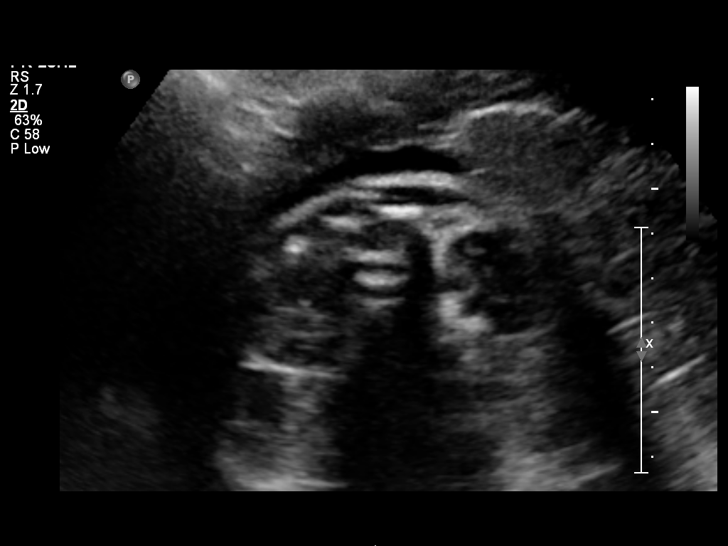
[im 22/60]
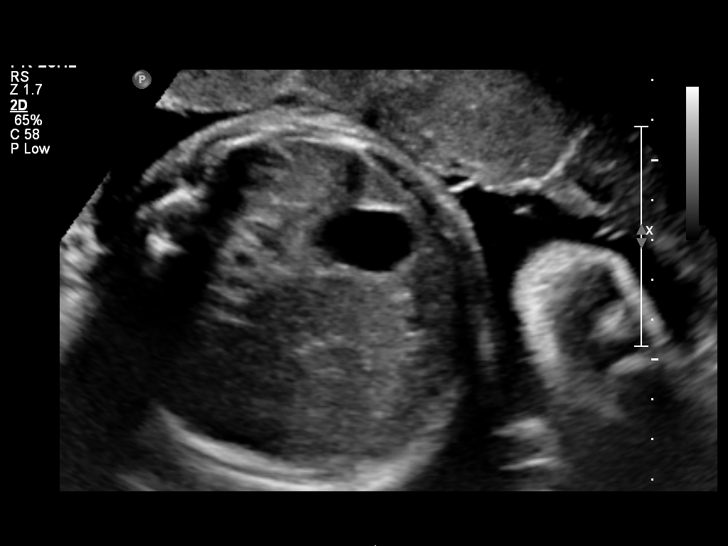
[im 27/60]
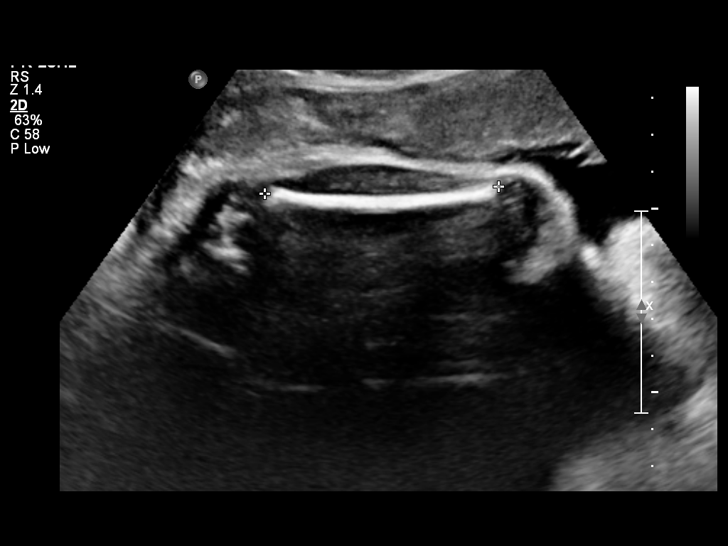
[im 33/60]
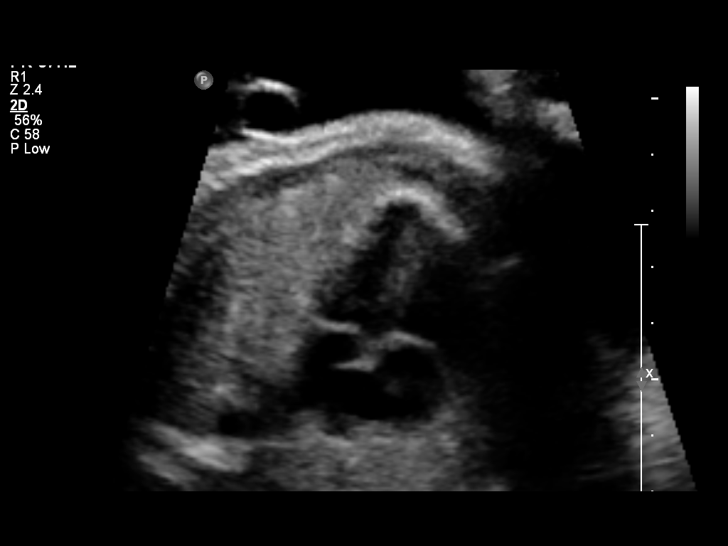
[im 38/60]
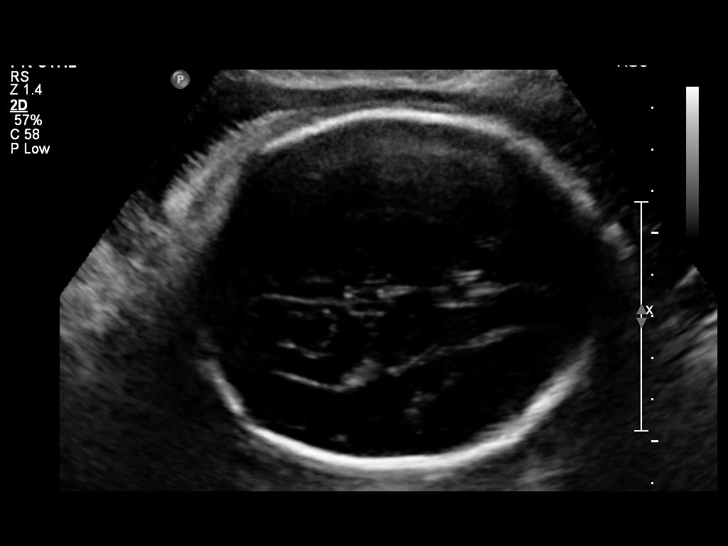
[im 42/60]
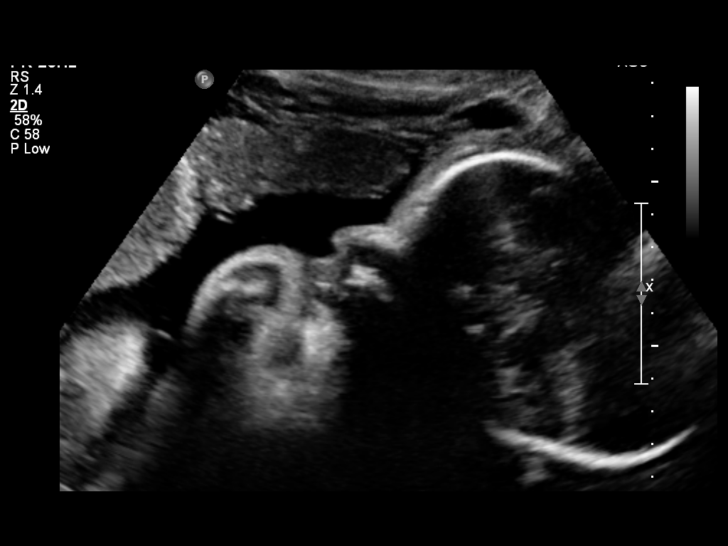
[im 49/60]
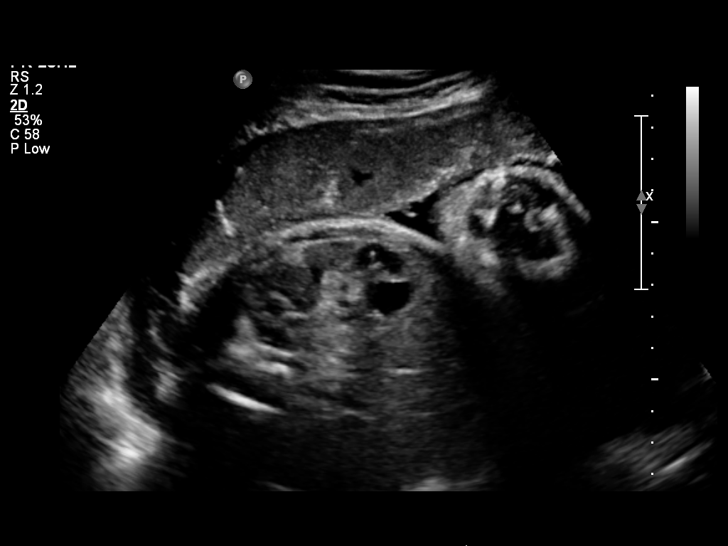
[im 53/60]
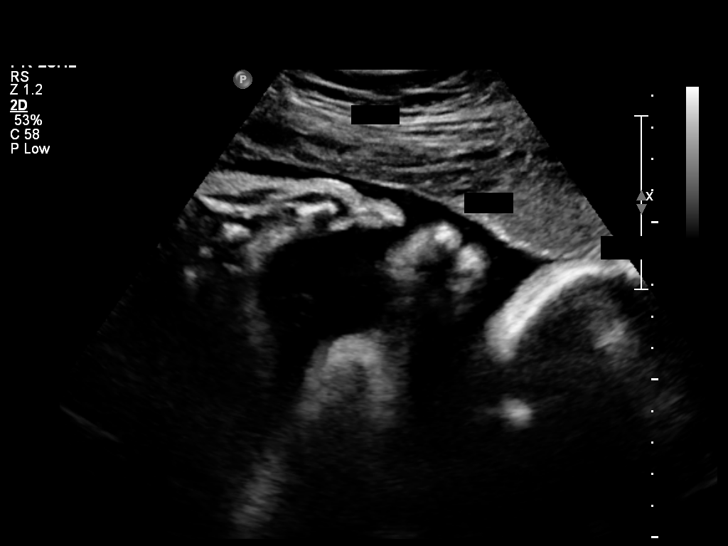
[im 57/60]
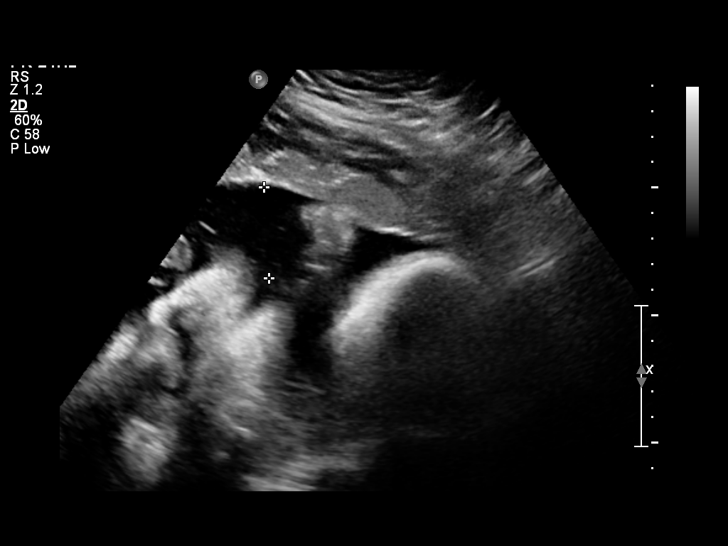

[12 of 28 positions shown; findings below may reference images not displayed]

OBSTETRICS REPORT
                      (Signed Final 11/16/2014 [DATE])

Service(s) Provided

 US OB FOLLOW UP                                       76816.1
Indications

 Poor obstetric history (prior pre-term labor x 2
 pregnancies)
 Poor obstetric history: Previous preterm delivery
 (34 weeks)
 Previous cesarean section x 2
 Uterine size-date discrepancy, third trimester
Fetal Evaluation

 Num Of Fetuses:    1
 Fetal Heart Rate:  155                          bpm
 Cardiac Activity:  Observed
 Presentation:      Cephalic
 Placenta:          Anterior, above cervical os
 P. Cord            Previously Visualized
 Insertion:

 Amniotic Fluid
 AFI FV:      Subjectively within normal limits
 AFI Sum:     18.95   cm       70  %Tile     Larg Pckt:    6.47  cm
 RUQ:   5.56    cm   RLQ:    3.36   cm    LUQ:   6.47    cm   LLQ:    3.56   cm
Biometry

 BPD:     84.1  mm     G. Age:  33w 6d                CI:        75.16   70 - 86
                                                      FL/HC:      20.8   19.4 -

 HC:     307.7  mm     G. Age:  34w 2d       18  %    HC/AC:      0.97   0.96 -

 AC:     317.7  mm     G. Age:  35w 5d       88  %    FL/BPD:     76.1   71 - 87
 FL:        64  mm     G. Age:  33w 1d       14  %    FL/AC:      20.1   20 - 24

 Est. FW:    7214  gm      5 lb 8 oz     67  %
Gestational Age

 LMP:           34w 2d        Date:  03/21/14                 EDD:   12/26/14
 U/S Today:     34w 2d                                        EDD:   12/26/14
 Best:          34w 2d     Det. By:  LMP  (03/21/14)          EDD:   12/26/14
Anatomy

 Cranium:          Appears normal         Aortic Arch:      Previously seen
 Fetal Cavum:      Appears normal         Ductal Arch:      Previously seen
 Ventricles:       Appears normal         Diaphragm:        Appears normal
 Choroid Plexus:   Previously seen        Stomach:          Appears normal, left
                                                            sided
 Cerebellum:       Previously seen        Abdomen:          Appears normal
 Posterior Fossa:  Previously seen        Abdominal Wall:   Previously seen
 Nuchal Fold:      Previously seen        Cord Vessels:     Previously seen
 Face:             Orbits and profile     Kidneys:          Appear normal
                   previously seen
 Lips:             Appears normal         Bladder:          Appears normal
 Heart:            Appears normal         Spine:            Previously seen
                   (4CH, axis, and
                   situs)
 RVOT:             Appears normal         Lower             Previously seen
                                          Extremities:
 LVOT:             Previously seen        Upper             Previously seen
                                          Extremities:

 Other:  Female gender. Heels and  Nasal bone previously visualized.
Cervix Uterus Adnexa

 Cervix:       Not visualized (advanced GA >68wks)

 Adnexa:     No abnormality visualized.
Impression

 Single IUP at 34w 2d
 Normal interval anatomy
 Fetal growth is appropriate (67th %tile)
 Normal amniotic fluid volume
Recommendations

 Follow-up ultrasounds as clinically indicated.

 questions or concerns.

## 2018-05-14 ENCOUNTER — Encounter: Payer: Self-pay | Admitting: Family Medicine

## 2018-05-14 ENCOUNTER — Ambulatory Visit (INDEPENDENT_AMBULATORY_CARE_PROVIDER_SITE_OTHER): Payer: 59 | Admitting: Family Medicine

## 2018-05-14 VITALS — BP 126/65 | HR 79 | Temp 98.5°F | Resp 16 | Ht 62.0 in | Wt 155.2 lb

## 2018-05-14 DIAGNOSIS — N6320 Unspecified lump in the left breast, unspecified quadrant: Secondary | ICD-10-CM | POA: Diagnosis not present

## 2018-05-14 NOTE — Assessment & Plan Note (Signed)
Diagnostic mammogram  rto prn

## 2018-05-14 NOTE — Progress Notes (Signed)
Patient ID: Loretta Sanders, female    DOB: Jul 12, 1986  Age: 32 y.o. MRN: [id number]    Subjective:  Subjective  HPI Loretta Sanders presents for pain and Lump in L breast x 5 months   Review of Systems  Constitutional: Negative for chills and fever.  HENT: Negative for congestion and hearing loss.   Eyes: Negative for discharge.  Respiratory: Negative for cough and shortness of breath.   Cardiovascular: Negative for chest pain, palpitations and leg swelling.  Gastrointestinal: Negative for abdominal pain, blood in stool, constipation, diarrhea, nausea and vomiting.  Genitourinary: Negative for dysuria, frequency, hematuria and urgency.  Musculoskeletal: Negative for back pain and myalgias.  Skin: Negative for rash.  Allergic/Immunologic: Negative for environmental allergies.  Neurological: Negative for dizziness, weakness and headaches.  Hematological: Does not bruise/bleed easily.  Psychiatric/Behavioral: Negative for suicidal ideas. The patient is not nervous/anxious.     History Past Medical History:  Diagnosis Date  . Allergy   . Complication of anesthesia 2009   felt pain during last CS  . Headache   . Preterm labor     She has a past surgical history that includes Kidney surgery; Cesarean section; Appendectomy; Urethral dilation; Cesarean section with bilateral tubal ligation (N/A, 12/19/2014); and Tubal ligation (Bilateral, 12/19/2014).   Her family history includes COPD in her maternal grandfather, paternal grandfather, and paternal grandmother.She reports that she has never smoked. She has never used smokeless tobacco. She reports that she drinks alcohol. She reports that she does not use drugs.  No current outpatient medications on file prior to visit.   No current facility-administered medications on file prior to visit.      Objective:  Objective  Physical Exam  Constitutional: She appears well-developed and well-nourished.  Pulmonary/Chest: Right breast exhibits no  inverted nipple, no mass, no nipple discharge and no skin change. Left breast exhibits mass. There is breast tenderness.    Nursing note and vitals reviewed.  BP 126/65 (BP Location: Right Arm, Cuff Size: Normal)   Pulse 79   Temp 98.5 F (36.9 C) (Oral)   Resp 16   Ht 5\' 2"  (1.575 m)   Wt 155 lb 3.2 oz (70.4 kg)   LMP 04/30/2018   SpO2 100%   BMI 28.39 kg/m  Wt Readings from Last 3 Encounters:  05/14/18 155 lb 3.2 oz (70.4 kg)  07/09/16 158 lb (71.7 kg)  12/18/14 175 lb (79.4 kg)     Lab Results  Component Value Date   WBC 10.6 (H) 12/20/2014   HGB 10.9 (L) 12/20/2014   HCT 32.9 (L) 12/20/2014   PLT 186 12/20/2014   GLUCOSE 88 07/09/2016   ALT 12 07/09/2016   AST 19 07/09/2016   NA 140 07/09/2016   K 3.6 07/09/2016   CL 104 07/09/2016   CREATININE 0.66 07/09/2016   BUN 11 07/09/2016   CO2 27 07/09/2016   TSH 1.18 07/09/2016    No results found.   Assessment & Plan:  Plan  I have discontinued Loretta Sanders's Prenat-Fe Carbonyl-FA-Omega 3 (ONE-A-DAY WOMENS PRENATAL 1 PO) and loratadine.  No orders of the defined types were placed in this encounter.   Problem List Items Addressed This Visit      Unprioritized   Left breast mass - Primary    Diagnostic mammogram  rto prn        Relevant Orders   POCT urine pregnancy   MM DIAG BREAST TOMO BILATERAL   US BREAST LTD UNI LEFT INC AXILLA  Follow-up: Return if symptoms worsen or fail to improve.  Ann Held, DO

## 2018-05-18 ENCOUNTER — Ambulatory Visit
Admission: RE | Admit: 2018-05-18 | Discharge: 2018-05-18 | Disposition: A | Discharge status: Home / Self Care | Payer: 59 | Source: Ambulatory Visit | Attending: Family Medicine | Admitting: Family Medicine

## 2018-05-18 DIAGNOSIS — N6320 Unspecified lump in the left breast, unspecified quadrant: Secondary | ICD-10-CM

## 2018-05-18 DIAGNOSIS — R922 Inconclusive mammogram: Secondary | ICD-10-CM | POA: Diagnosis not present

## 2018-05-18 LAB — POCT URINE PREGNANCY: PREG TEST UR: NEGATIVE

## 2019-02-07 ENCOUNTER — Ambulatory Visit: Payer: Self-pay | Admitting: *Deleted

## 2019-02-07 NOTE — Telephone Encounter (Signed)
Pt called with concerning symptoms: headache, low grade fever, body aches, fatigue and chills. She denies shortness of breath. She went to the beach a couple of weeks ago and started feeling bad about 3 days ago.  Home care advice given to patient and to call back for increase in symptoms.  Advised of having a virtual appointment. Pt voiced understanding. Flow at [facility] notified regarding an appointment. Call transferred to the office. .Reason for Disposition . [1] COVID-19 infection suspected by caller or triager AND [2] mild symptoms (cough, fever, or others) AND [2] no complications or SOB  Answer Assessment - Initial Assessment Questions 1. COVID-19 DIAGNOSIS: "Who made your Coronavirus (COVID-19) diagnosis?" "Was it confirmed by a positive lab test?" If not diagnosed by a HCP, ask "Are there lots of cases (community spread) where you live?" (See public health department website, if unsure)     In the community 2. ONSET: "When did the COVID-19 symptoms start?"      3 days ago 3. WORST SYMPTOM: "What is your worst symptom?" (e.g., cough, fever, shortness of breath, muscle aches)     Body aches 4. COUGH: "Do you have a cough?" If so, ask: "How bad is the cough?"       Yes, dry 5. FEVER: "Do you have a fever?" If so, ask: "What is your temperature, how was it measured, and when did it start?"     Yes 100.1 6. RESPIRATORY STATUS: "Describe your breathing?" (e.g., shortness of breath, wheezing, unable to speak)      Breathing normally  7. BETTER-SAME-WORSE: "Are you getting better, staying the same or getting worse compared to yesterday?"  If getting worse, ask, "In what way?"     Feels worst today 8. HIGH RISK DISEASE: "Do you have any chronic medical problems?" (e.g., asthma, heart or lung disease, weak immune system, etc.)     no 9. PREGNANCY: "Is there any chance you are pregnant?" "When was your last menstrual period?"     Not pregnant. LMP the last week in [place] 10. OTHER  SYMPTOMS: "Do you have any other symptoms?"  (e.g., chills, fatigue, headache, loss of smell or taste, muscle pain, sore throat)       Headache, muscle pain, fatigue, chills  Protocols used: CORONAVIRUS (COVID-19) DIAGNOSED OR SUSPECTED-A-AH

## 2019-02-07 NOTE — Telephone Encounter (Signed)
Virtual visit scheduled.  

## 2019-02-08 ENCOUNTER — Telehealth: Payer: Self-pay | Admitting: Family Medicine

## 2019-02-08 ENCOUNTER — Other Ambulatory Visit: Payer: 59

## 2019-02-08 ENCOUNTER — Other Ambulatory Visit: Payer: Self-pay

## 2019-02-08 ENCOUNTER — Ambulatory Visit (INDEPENDENT_AMBULATORY_CARE_PROVIDER_SITE_OTHER): Payer: 59 | Admitting: Family Medicine

## 2019-02-08 ENCOUNTER — Encounter: Payer: Self-pay | Admitting: Family Medicine

## 2019-02-08 ENCOUNTER — Telehealth: Payer: Self-pay | Admitting: General Practice

## 2019-02-08 DIAGNOSIS — Z20828 Contact with and (suspected) exposure to other viral communicable diseases: Secondary | ICD-10-CM | POA: Diagnosis not present

## 2019-02-08 DIAGNOSIS — K929 Disease of digestive system, unspecified: Secondary | ICD-10-CM | POA: Diagnosis not present

## 2019-02-08 DIAGNOSIS — R6889 Other general symptoms and signs: Secondary | ICD-10-CM | POA: Diagnosis not present

## 2019-02-08 DIAGNOSIS — Z20822 Contact with and (suspected) exposure to covid-19: Secondary | ICD-10-CM

## 2019-02-08 MED ORDER — ONDANSETRON 4 MG PO TBDP: 4.0000 mg | ORAL_TABLET | Freq: Three times a day (TID) | ORAL | 0 refills | Status: AC | PRN

## 2019-02-08 NOTE — Telephone Encounter (Signed)
Patient needs to be tested for COVID

## 2019-02-08 NOTE — Telephone Encounter (Signed)
Pt has been scheduled for Covid testing.   Scheduled with pt directly.  Pt was referred by: Riki Sheer DO

## 2019-02-08 NOTE — Progress Notes (Signed)
Chief Complaint  Patient presents with  . Headache  . Fever  . Abdominal Pain    Loretta Sanders here for URI complaints. Due to COVID-19 pandemic, we are interacting via web portal for an electronic face-to-face visit. I verified patient's ID using 2 identifiers. Patient agreed to proceed with visit via this method. Patient is at home, I am at home. Patient and I are present for visit.   Duration: 4 days  Associated symptoms: Fever (100.5 F), sinus congestion, shortness of breath, myalgia, N/V/D, abd pain, and cough Denies: sinus pain, rhinorrhea, itchy watery eyes, ear pain, ear drainage, sore throat and wheezing Treatment to date: Tylenol Sick contacts: None known; husband starting to feel bad (after her illness started) and has lost sense of taste They were at beach where it was "packed" 2 weeks ago and also went out to eat.   ROS:  Const: + fevers HEENT: As noted in HPI Lungs: +cough  Past Medical History:  Diagnosis Date  . Allergy   . Complication of anesthesia 2009   felt pain during last CS  . Headache   . Preterm labor    Exam No conversational dyspnea Age appropriate judgment and insight Nml affect and mood  Digestive disorder - Plan: Zofran, supportive care, fluid replacement w electrolytes  Flu-like symptoms - Plan: Tylenol, ibuprofen, hand hygiene  Rec'd she get tested for COVID-19.  Continue to push fluids, practice good hand hygiene, cover mouth when coughing. Pt voiced understanding and agreement to the plan.  [place], DO 02/08/19 10:19 AM

## 2019-02-08 NOTE — Addendum Note (Signed)
Addended by: Dimple Nanas on: 02/08/2019 11:35 AM   Modules accepted: Orders

## 2019-02-08 NOTE — Telephone Encounter (Signed)
Pt has been scheduled for Covid testing.  ° °

## 2019-02-10 LAB — NOVEL CORONAVIRUS, NAA: SARS-CoV-2, NAA: DETECTED — AB

## 2019-03-28 ENCOUNTER — Encounter: Payer: Self-pay | Admitting: Family Medicine

## 2020-02-24 IMAGING — MG DIGITAL DIAGNOSTIC BILATERAL MAMMOGRAM WITH TOMO AND CAD
6 of 10 series · 6 of 30 positions shown · non-contrast
Comparison: Baseline exam

CLINICAL DATA: Palpable mass in the central portion of the LEFT
breast, first noted 1-2 months ago.

EXAM:
DIGITAL DIAGNOSTIC BILATERAL MAMMOGRAM WITH CAD AND TOMO
ULTRASOUND LEFT BREAST

[R MLO synth-2D]
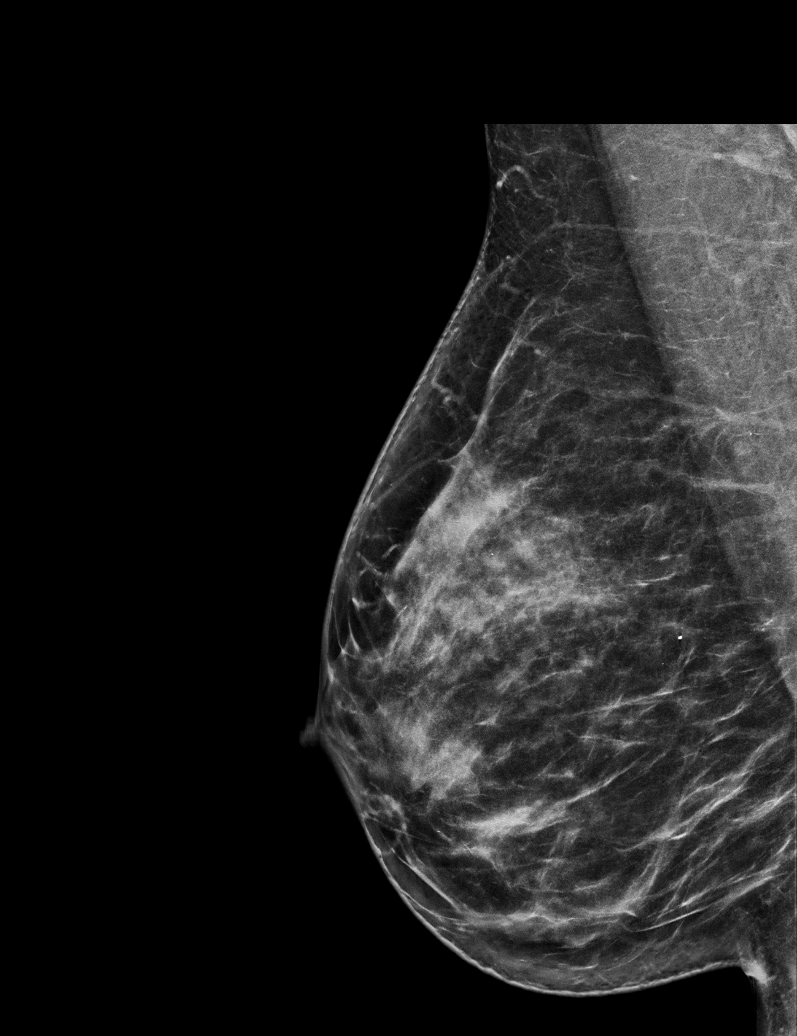

[R CC synth-2D]
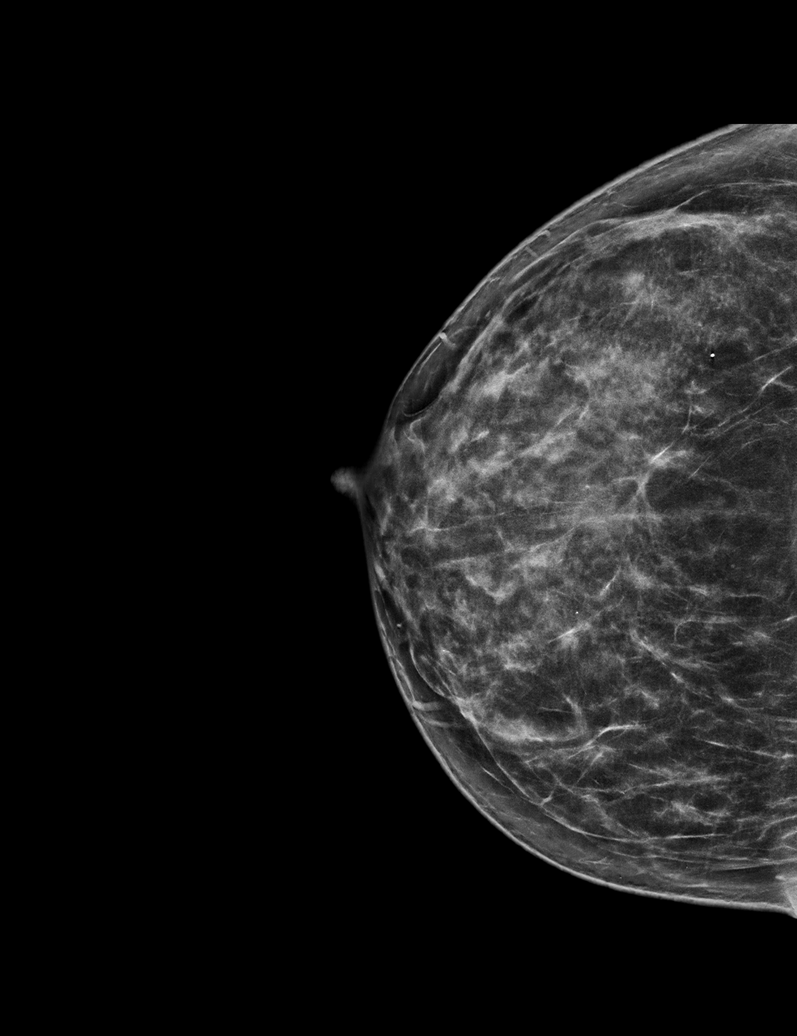

[L CC synth-2D]
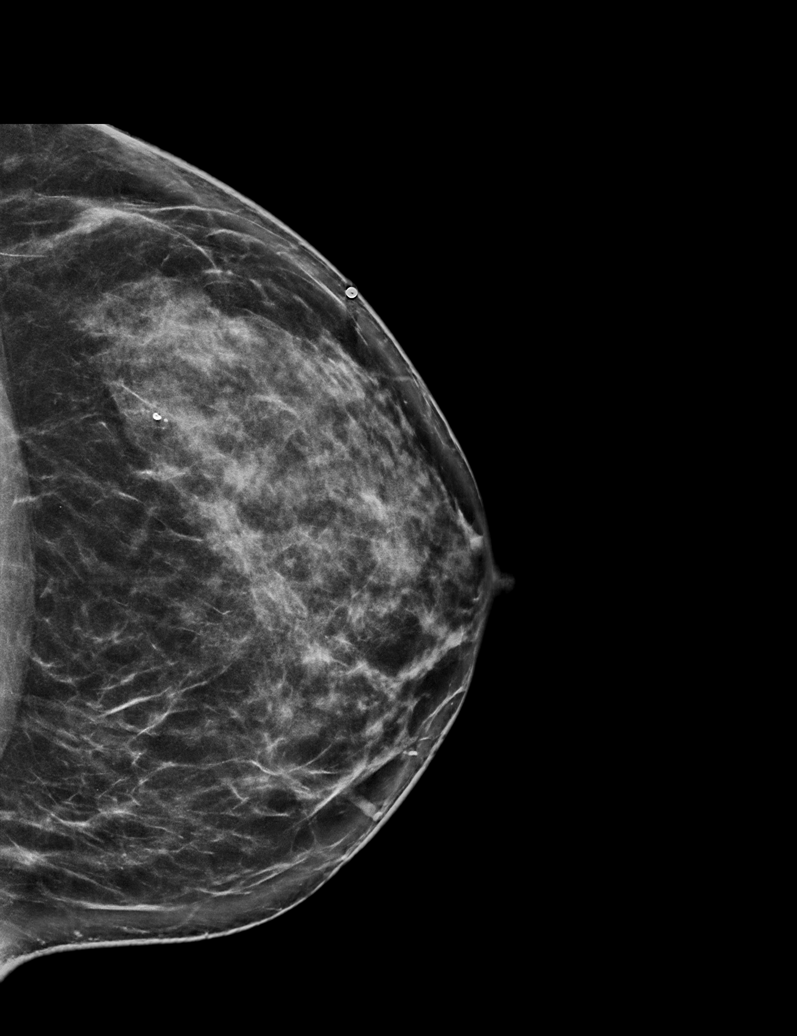

[L MLO synth-2D]
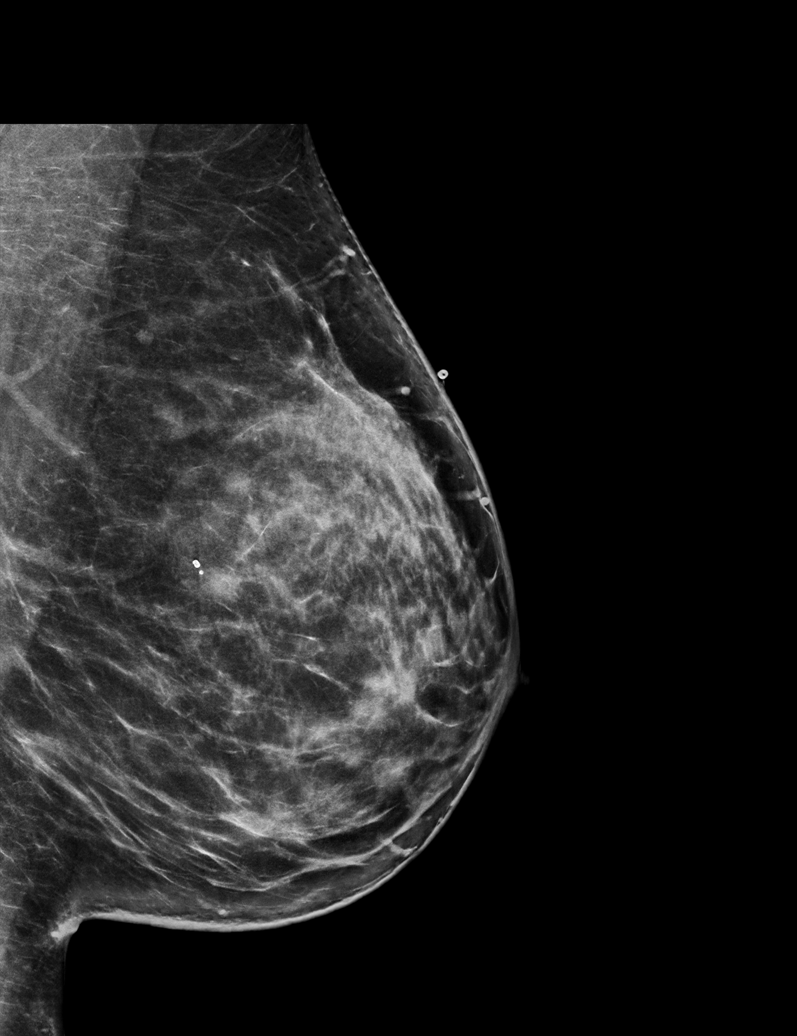

[L TAN synth-2D]
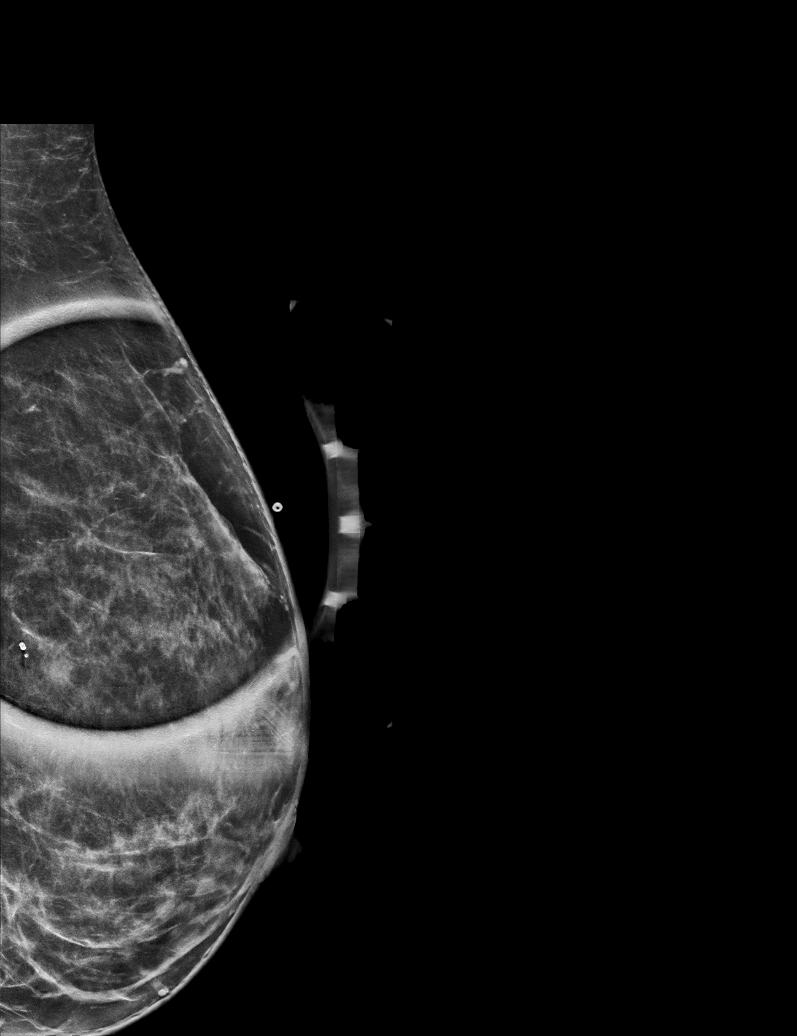

[L TAN tomo · tomo slice 34/67.0]
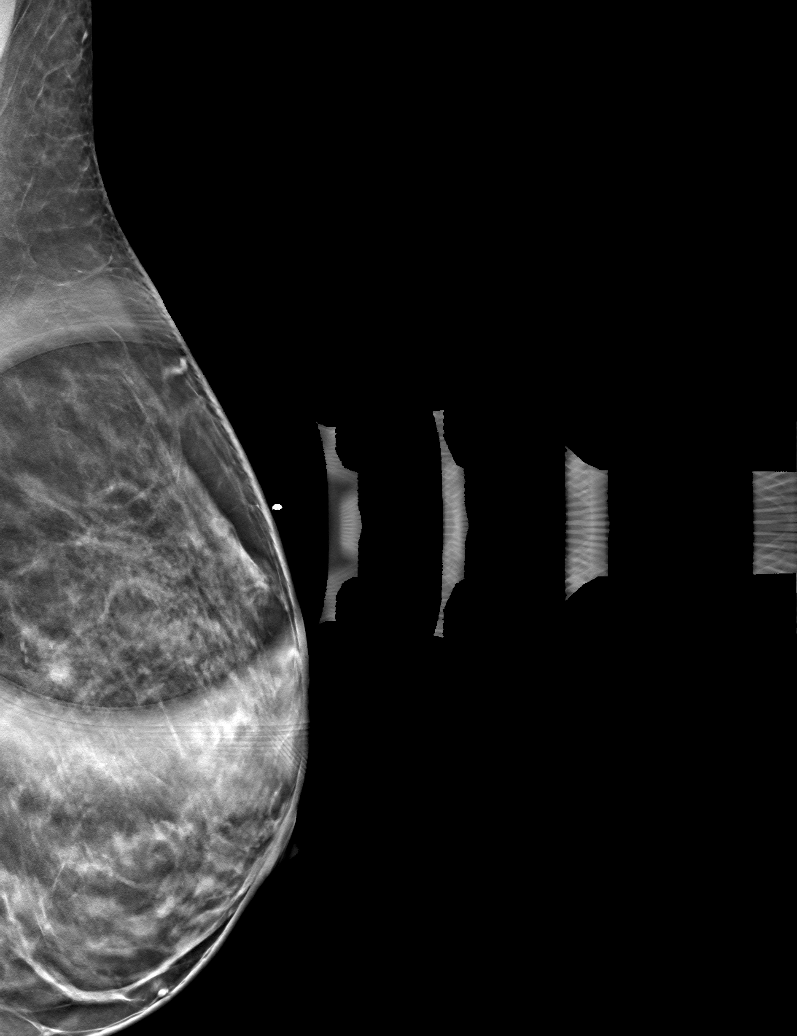

[6 of 30 positions shown; findings below may reference images not displayed]

ACR Breast Density Category c: The breast tissue is heterogeneously
dense, which may obscure small masses.
FINDINGS: No suspicious mass, distortion, or microcalcifications are
identified to suggest presence of malignancy. Spot tangential view
in the UPPER-OUTER QUADRANT of the LEFT breast shows normal
appearing dense fibroglandular tissue.

Mammographic images were processed with CAD.

On physical exam, I palpate nonfocal thickening in the UPPER-OUTER
QUADRANT of the LEFT breast, asymmetric compared to the RIGHT
breast. I palpate no discrete mass.

Targeted ultrasound is performed, showing normal appearing, dense
fibroglandular tissue in the central and UPPER OUTER portion of the
LEFT breast. No suspicious mass, distortion, or acoustic shadowing
is demonstrated with ultrasound.
IMPRESSION: No mammographic or ultrasound evidence for malignancy.

RECOMMENDATION:
Screening mammogram at age 39 unless there are persistent or
intervening clinical concerns. (Code:[id number])

I have discussed the findings and recommendations with the patient
and her husband. Results were also provided in writing at the
conclusion of the visit. If applicable, a reminder letter will be
sent to the patient regarding the next appointment.

BI-RADS CATEGORY  1: Negative.

## 2020-08-14 ENCOUNTER — Telehealth: Payer: 59 | Admitting: Physician Assistant

## 2020-08-14 DIAGNOSIS — U071 COVID-19: Secondary | ICD-10-CM

## 2020-08-14 MED ORDER — FLUTICASONE PROPIONATE 50 MCG/ACT NA SUSP: 2.0000 | Freq: Every day | NASAL | 0 refills | Status: AC

## 2020-08-14 MED ORDER — BENZONATATE 100 MG PO CAPS: 100.0000 mg | ORAL_CAPSULE | Freq: Three times a day (TID) | ORAL | 0 refills | Status: AC

## 2020-08-14 MED ORDER — ALBUTEROL SULFATE HFA 108 (90 BASE) MCG/ACT IN AERS: 2.0000 | INHALATION_SPRAY | Freq: Four times a day (QID) | RESPIRATORY_TRACT | 0 refills | Status: AC | PRN

## 2020-08-14 NOTE — Progress Notes (Signed)
Based on what you shared with me, I feel your condition warrants further evaluation and I recommend that you be seen for a face to face office visit. I am concerned that you are having shortness of breath and feel that you need to be seen in person so that you can have your vital signs checked to ensure your oxygen is not low.   I have written you a work note and have also prescribed Fluticasone nasal spray for your congestion and Albuterol and Tessalon for your cough and shortness of breath.   NOTE: If you entered your credit card information for this eVisit, you will not be charged. You may see a "hold" on your card for the $35 but that hold will drop off and you will not have a charge processed.   If you are having a true medical emergency please call 911.      For an urgent face to face visit, [place] has five urgent care centers for your convenience:     [facility] at [facility] Directions [phone] [address] [place], [place] [address] . 10 am - 6pm Monday - Friday    [facility]) Get Driving Directions [phone] [address] [place], [place] [address] . 10 am to 8 pm Monday-Friday . 12 pm to 8 pm [facility] Get Driving Directions [phone] [address], [address] [place], [place] [address] . 8 am to 8 pm Monday-Friday . 9 am to 6 pm Saturday . 11 am to 6 pm Sunday     [facility] at [facility] Get Driving Directions  [phone] [address] [place], [place] [address] . 8 am to 8 pm Monday-Friday . 8 am to 4 pm [facility] at [facility] Directions [phone] [address] [place], [place] [address] . 12 pm to 6 pm Monday-Friday      Your e-visit answers were reviewed by a board certified advanced clinical practitioner to  complete your personal care plan.  Thank you for using e-Visits.    Approximately 5 minutes was spent documenting and reviewing patient's chart.

## 2021-11-25 ENCOUNTER — Encounter: Payer: Self-pay | Admitting: Family Medicine

## 2021-11-25 ENCOUNTER — Ambulatory Visit (INDEPENDENT_AMBULATORY_CARE_PROVIDER_SITE_OTHER): Payer: 59 | Admitting: Family Medicine

## 2021-11-25 VITALS — BP 118/76 | HR 91 | Temp 97.1°F | Ht 62.0 in | Wt 175.6 lb

## 2021-11-25 DIAGNOSIS — M79622 Pain in left upper arm: Secondary | ICD-10-CM | POA: Diagnosis not present

## 2021-11-25 DIAGNOSIS — L089 Local infection of the skin and subcutaneous tissue, unspecified: Secondary | ICD-10-CM | POA: Diagnosis not present

## 2021-11-25 MED ORDER — DOXYCYCLINE HYCLATE 100 MG PO TABS: 100.0000 mg | ORAL_TABLET | Freq: Two times a day (BID) | ORAL | 0 refills | Status: AC

## 2021-11-25 NOTE — Progress Notes (Signed)
? ?Acute Office Visit ? ?Subjective:  ? ? Patient ID: Loretta Sanders, female    DOB: [birth year], 36 y.o.   MRN: [id number] ? ?Chief Complaint  ?Patient presents with  ? Cyst  ?  Pt. C/o a cyst in left armpit x 1week  ? ? ?HPI ?Patient is in today for left axilla pain and "bump".  Thought it was an ingrown hair.  Pain is getting worse.  Treating pain with ibuprofen which does help.  Denies fevers or drainage.  Denies history of this.  Denies trauma. ? ?Past Medical History:  ?Diagnosis Date  ? Allergy   ? Complication of anesthesia 2009  ? felt pain during last CS  ? Headache   ? Preterm labor   ? ? ?Past Surgical History:  ?Procedure Laterality Date  ? APPENDECTOMY    ? CESAREAN SECTION    ? X 2 in 2008, 2009  ? CESAREAN SECTION WITH BILATERAL TUBAL LIGATION N/A 12/19/2014  ? Procedure: CESAREAN SECTION;  Surgeon: Catalina Antigua, MD;  Location: [facility];  Service: Obstetrics;  Laterality: N/A;  Tracie S. RNFA confirmed  ? KIDNEY SURGERY    ? age 51  ? TUBAL LIGATION Bilateral 12/19/2014  ? Procedure: BILATERAL TUBAL LIGATION;  Surgeon: Catalina Antigua, MD;  Location: [facility];  Service: Obstetrics;  Laterality: Bilateral;  ? URETHRAL DILATION    ? ? ?Family History  ?Problem Relation Age of Onset  ? COPD Maternal Grandfather   ?     lung  ? COPD Paternal Grandmother   ?     lung  ? COPD Paternal Grandfather   ?     lung cancer  ? ? ?Social History  ? ?Socioeconomic History  ? Marital status: Married  ?  Spouse name: Not on file  ? Number of children: Not on file  ? Years of education: Not on file  ? Highest education level: Not on file  ?Occupational History  ? Not on file  ?Tobacco Use  ? Smoking status: Never  ? Smokeless tobacco: Never  ?Substance and Sexual Activity  ? Alcohol use: Yes  ?  Comment: rare  ? Drug use: No  ? Sexual activity: Yes  ?  Partners: Male  ?  Birth control/protection: None  ?  Comment: pregnant  ?Other Topics Concern  ? Not on file  ?Social History Narrative  ? Not on file  ? ?Social Determinants  of Health  ? ?Financial Resource Strain: Not on file  ?Food Insecurity: Not on file  ?Transportation Needs: Not on file  ?Physical Activity: Not on file  ?Stress: Not on file  ?Social Connections: Not on file  ?Intimate Partner Violence: Not on file  ? ? ?Outpatient Medications Prior to Visit  ?Medication Sig Dispense Refill  ? albuterol (VENTOLIN HFA) 108 (90 Base) MCG/ACT inhaler Inhale 2 puffs into the lungs every 6 (six) hours as needed for wheezing or shortness of breath. 8 g 0  ? fluticasone (FLONASE) 50 MCG/ACT nasal spray Place 2 sprays into both nostrils daily. 16 g 0  ? ondansetron (ZOFRAN-ODT) 4 MG disintegrating tablet Take 1 tablet (4 mg total) by mouth every 8 (eight) hours as needed for nausea or vomiting. 20 tablet 0  ? ?No facility-administered medications prior to visit.  ? ? ?No Known Allergies ? ?Review of Systems ?As per HPI ?   ?Objective:  ?  ?Physical Exam ?Gen: NAD, resting comfortably ?CV: RRR with no murmurs appreciated ?Pulm: NWOB, CTAB with no crackles, wheezes,  or rhonchi ?GI: Normal bowel sounds present. Soft, Nontender, Nondistended. ?MSK: no edema, cyanosis, or clubbing noted ?Skin: left axilla small nodule which feels to be about 1 cm, mildly tender to palpation, warm, no drainage, mobile, fluctuant ?Neuro: grossly normal, moves all extremities ?Psych: Normal affect and thought content ? ?BP 118/76 (BP Location: Left Arm, Patient Position: Sitting, Cuff Size: Normal)   Pulse 91   Temp (!) 97.1 ?F (36.2 ?C) (Temporal)   Ht 5\' 2"  (1.575 m)   Wt 175 lb 9.6 oz (79.7 kg)   SpO2 98%   BMI 32.12 kg/m?  ?Wt Readings from Last 3 Encounters:  ?11/25/21 175 lb 9.6 oz (79.7 kg)  ?05/14/18 155 lb 3.2 oz (70.4 kg)  ?07/09/16 158 lb (71.7 kg)  ? ? ?Health Maintenance Due  ?Topic Date Due  ? COVID-19 Vaccine (1) Never done  ? Hepatitis C Screening  Never done  ? PAP SMEAR-Modifier  08/25/2016  ? ? ?There are no preventive care reminders to display for this patient. ? ? ?Lab Results   ?Component Value Date  ? TSH 1.18 07/09/2016  ? ?Lab Results  ?Component Value Date  ? WBC 10.6 (H) 12/20/2014  ? HGB 10.9 (L) 12/20/2014  ? HCT 32.9 (L) 12/20/2014  ? MCV 91.6 12/20/2014  ? PLT 186 12/20/2014  ? ?Lab Results  ?Component Value Date  ? NA 140 07/09/2016  ? K 3.6 07/09/2016  ? CO2 27 07/09/2016  ? GLUCOSE 88 07/09/2016  ? BUN 11 07/09/2016  ? CREATININE 0.66 07/09/2016  ? BILITOT 0.3 07/09/2016  ? ALKPHOS 61 07/09/2016  ? AST 19 07/09/2016  ? ALT 12 07/09/2016  ? PROT 7.1 07/09/2016  ? ALBUMIN 4.4 07/09/2016  ? CALCIUM 9.6 07/09/2016  ? GFR 110.44 07/09/2016  ? ?No results found for: CHOL ?No results found for: HDL ?No results found for: LDLCALC ?No results found for: TRIG ?No results found for: CHOLHDL ?No results found for: HGBA1C ? ?   ?Assessment & Plan:  ? ?Problem List Items Addressed This Visit   ?None ?Visit Diagnoses   ? ? Left axillary pain    -  Primary  ? Relevant Medications  ? doxycycline (VIBRA-TABS) 100 MG tablet  ? Skin infection      ? Relevant Medications  ? doxycycline (VIBRA-TABS) 100 MG tablet  ? ?  ? ?Local skin infection test tender at the left axilla ?Likely abscess with small.  Discussed treatment options and offered I&D, however patient elected to do this, discussed trial of antibiotics which patient elected to.  Denies any antibiotic allergies. ?Trial of doxycycline 100 mg twice daily for 7 days.,  Return precautions discussed, if no improvement 1 week patient to come back for I&D, may take OTC Tylenol for pain ?Meds ordered this encounter  ?Medications  ? doxycycline (VIBRA-TABS) 100 MG tablet  ?  Sig: Take 1 tablet (100 mg total) by mouth 2 (two) times daily for 7 days.  ?  Dispense:  14 tablet  ?  Refill:  0  ? ? ? ?07/11/2016, MD ? ?

## 2021-11-25 NOTE — Patient Instructions (Addendum)
It was a pleasure to see you today!  ? ?Please take doxycycline as prescribed.  May also take ibuprofen as needed.  Use warm compresses for pain and to help healing. ?Come back if not improving in 1 week and we will need to do an incision and drainage.  Go to the emergency department if you develop severe fevers, severe pain or other worrisome symptoms. ? ?Best,  ?Thomes Dinning, MD, MS ? ?11/25/2021 1:43 PM  ?

## 2024-06-29 ENCOUNTER — Encounter: Admitting: Family Medicine

## 2024-10-12 ENCOUNTER — Encounter: Admitting: Family Medicine
